# Patient Record
Sex: Female | Born: 1970 | Race: White | Hispanic: No | Smoking: Never smoker
Health system: Southern US, Community
[De-identification: ages and names within clinical notes are randomized; demographics above are authoritative.]

## PROBLEM LIST (undated history)

## (undated) DIAGNOSIS — I10 Essential (primary) hypertension: Secondary | ICD-10-CM

## (undated) HISTORY — PX: EXPLORATORY LAPAROTOMY: SUR591

---

## 2005-02-14 ENCOUNTER — Other Ambulatory Visit: Payer: Self-pay

## 2005-02-14 ENCOUNTER — Emergency Department: Payer: Self-pay | Admitting: Emergency Medicine

## 2006-08-02 ENCOUNTER — Encounter: Admission: RE | Admit: 2006-08-02 | Discharge: 2006-08-02 | Payer: Self-pay | Admitting: Family Medicine

## 2008-10-12 ENCOUNTER — Emergency Department: Payer: Self-pay | Admitting: Internal Medicine

## 2009-09-14 IMAGING — CR DG CHEST 2V
1 series · 2 of 2 positions shown · non-contrast
Comparison: none

REASON FOR EXAM: Chest Pain
COMMENTS:

PROCEDURE:     DXR - DXR CHEST PA (OR AP) AND LATERAL  - October 12, 2008  [DATE]
RESULT:     The lungs are well expanded. There is no focal infiltrate. The
heart is normal in size. There is no pleural effusion.

[Series 1: view not recorded · 0.17mm/px · 2 of 2 slices shown]
[im 1/2]
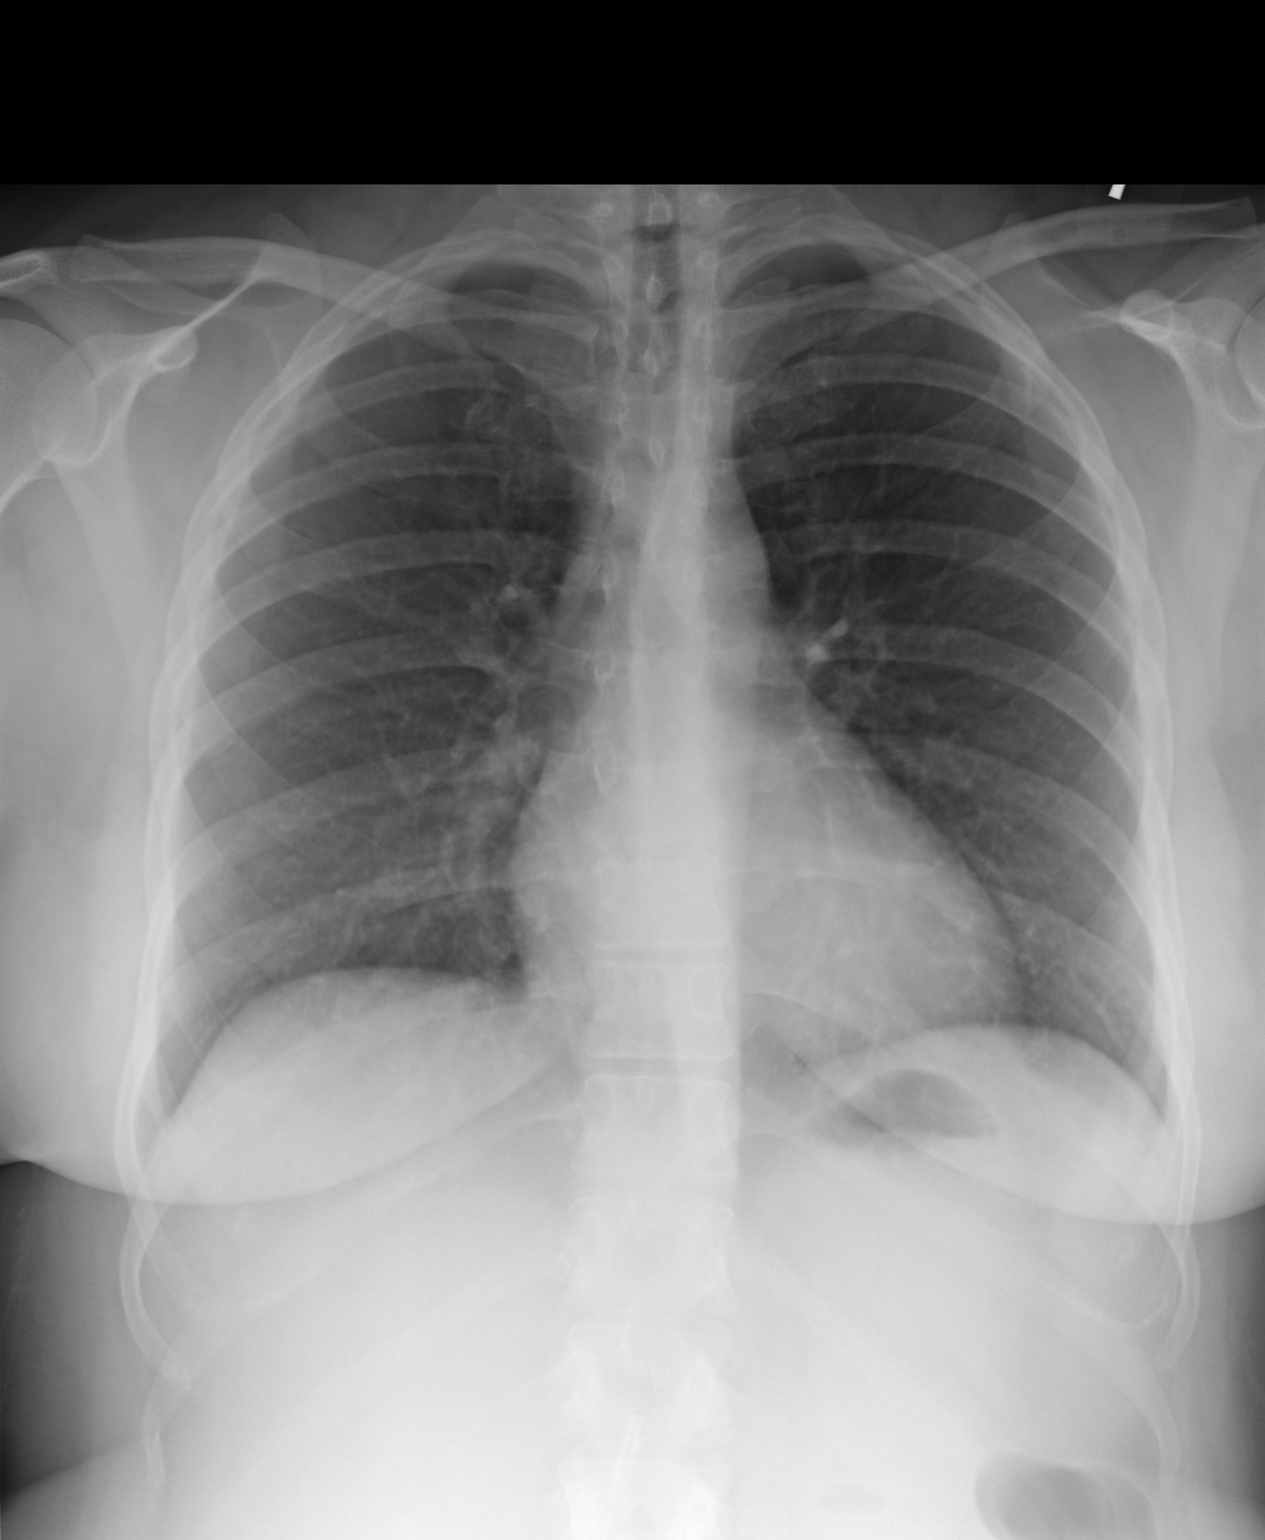
[im 2/2]
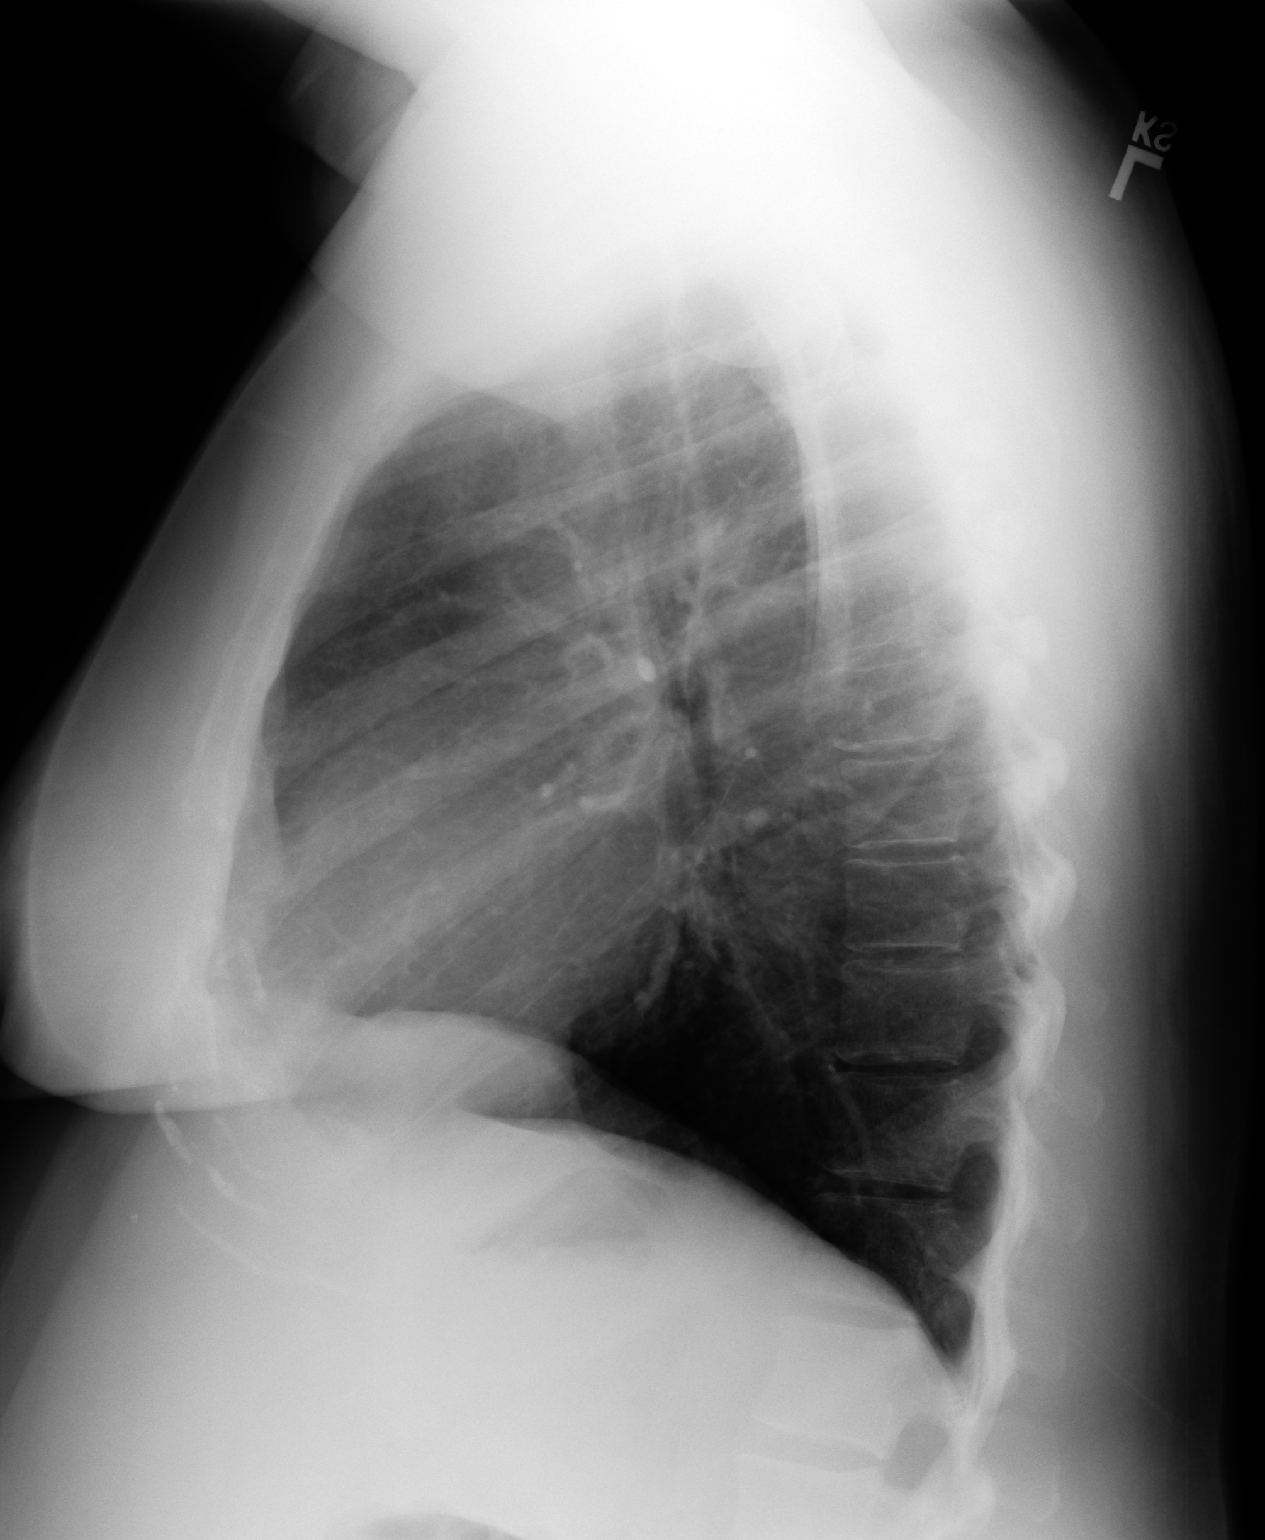

[2 of 2 positions shown; findings below may reference images not displayed]

IMPRESSION: I do not see objective evidence of acute cardiopulmonary abnormality.
Followup imaging is available if the patient's chest discomfort persists.

## 2010-04-15 ENCOUNTER — Ambulatory Visit: Payer: Self-pay

## 2010-04-27 ENCOUNTER — Ambulatory Visit: Payer: Self-pay

## 2018-03-30 ENCOUNTER — Other Ambulatory Visit: Payer: Self-pay | Admitting: Family Medicine

## 2018-03-30 DIAGNOSIS — Z1231 Encounter for screening mammogram for malignant neoplasm of breast: Secondary | ICD-10-CM

## 2018-04-20 ENCOUNTER — Encounter: Payer: Self-pay | Admitting: Radiology

## 2018-04-20 ENCOUNTER — Ambulatory Visit
Admission: RE | Admit: 2018-04-20 | Discharge: 2018-04-20 | Disposition: A | Discharge status: Home / Self Care | Payer: 59 | Source: Ambulatory Visit | Attending: Family Medicine | Admitting: Family Medicine

## 2018-04-20 DIAGNOSIS — Z1231 Encounter for screening mammogram for malignant neoplasm of breast: Secondary | ICD-10-CM

## 2019-08-08 ENCOUNTER — Other Ambulatory Visit: Payer: Self-pay | Admitting: Family Medicine

## 2019-08-09 ENCOUNTER — Other Ambulatory Visit: Payer: Self-pay | Admitting: Family Medicine

## 2019-08-09 DIAGNOSIS — N6321 Unspecified lump in the left breast, upper outer quadrant: Secondary | ICD-10-CM

## 2019-08-20 ENCOUNTER — Other Ambulatory Visit: Payer: Self-pay

## 2019-08-20 ENCOUNTER — Ambulatory Visit
Admission: RE | Admit: 2019-08-20 | Discharge: 2019-08-20 | Disposition: A | Discharge status: Home / Self Care | Payer: 59 | Source: Ambulatory Visit | Attending: Family Medicine | Admitting: Family Medicine

## 2019-08-20 DIAGNOSIS — N6321 Unspecified lump in the left breast, upper outer quadrant: Secondary | ICD-10-CM

## 2019-10-23 ENCOUNTER — Other Ambulatory Visit: Payer: Self-pay | Admitting: Student

## 2019-10-23 DIAGNOSIS — R748 Abnormal levels of other serum enzymes: Secondary | ICD-10-CM

## 2019-10-29 ENCOUNTER — Ambulatory Visit: Payer: 59 | Attending: Student

## 2020-07-22 IMAGING — MG DIGITAL DIAGNOSTIC BILATERAL MAMMOGRAM WITH TOMO AND CAD
6 of 12 series · 6 of 36 positions shown · non-contrast
Comparison: Previous exam(s).

CLINICAL DATA: Recent focal soreness and palpable lumpiness in the
axillary tail of the left breast/lower left axilla.

EXAM:
DIGITAL DIAGNOSTIC BILATERAL MAMMOGRAM WITH CAD AND TOMO
ULTRASOUND LEFT BREAST

[L MLO synth-2D]
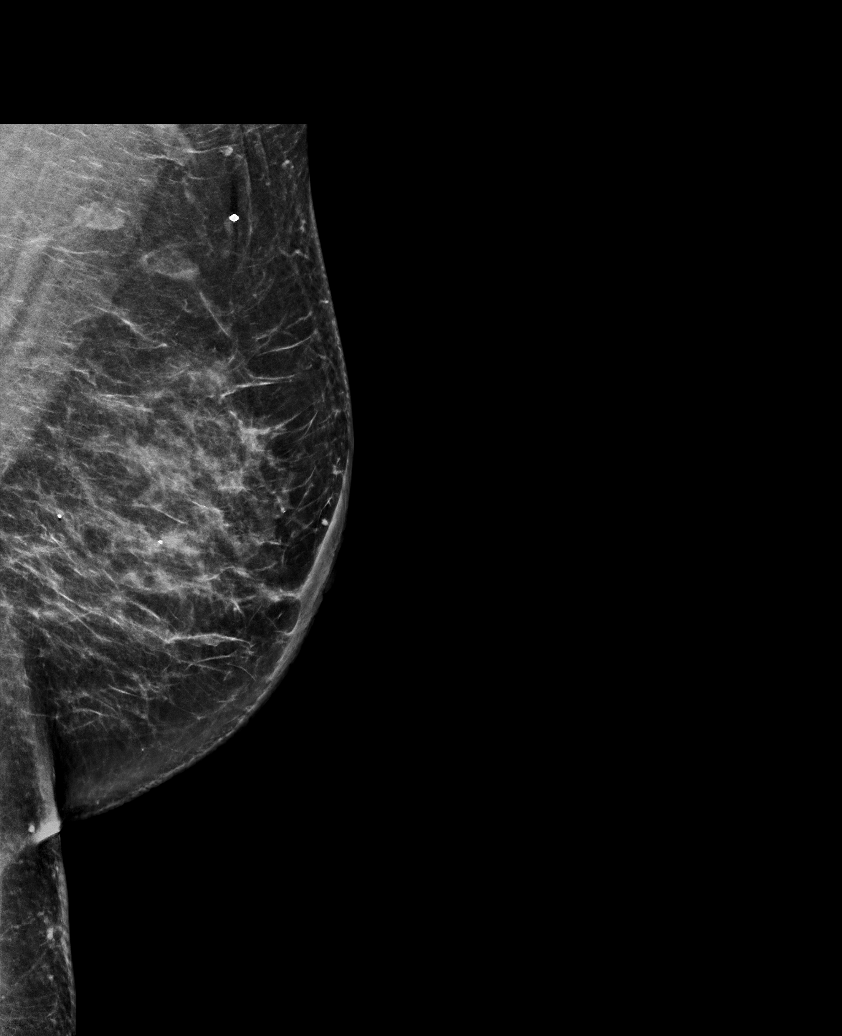

[L TAN synth-2D]
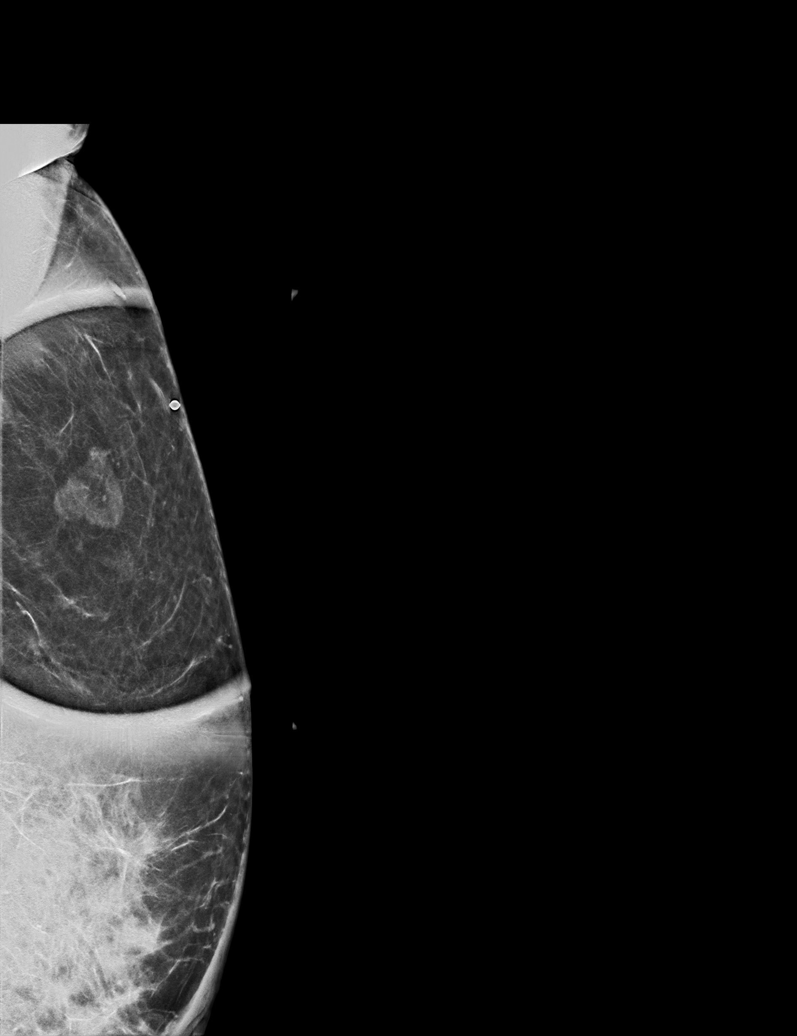

[R MLO synth-2D]
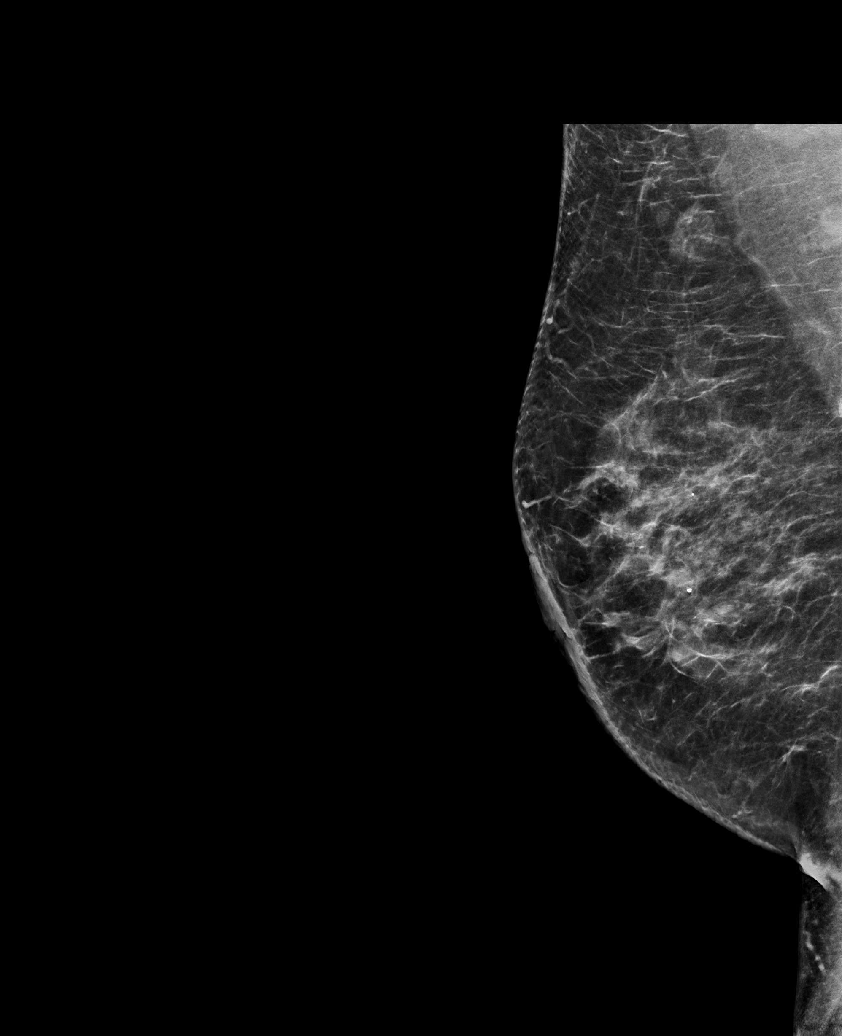

[R CC synth-2D]
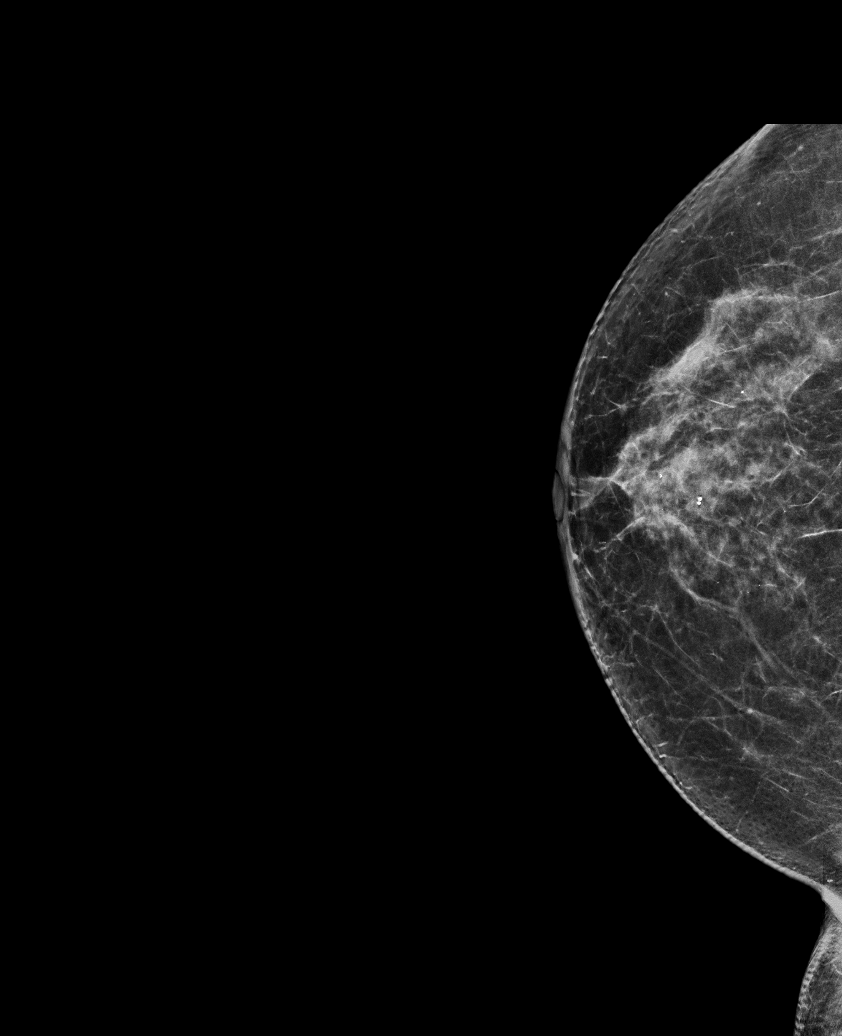

[L CC synth-2D]
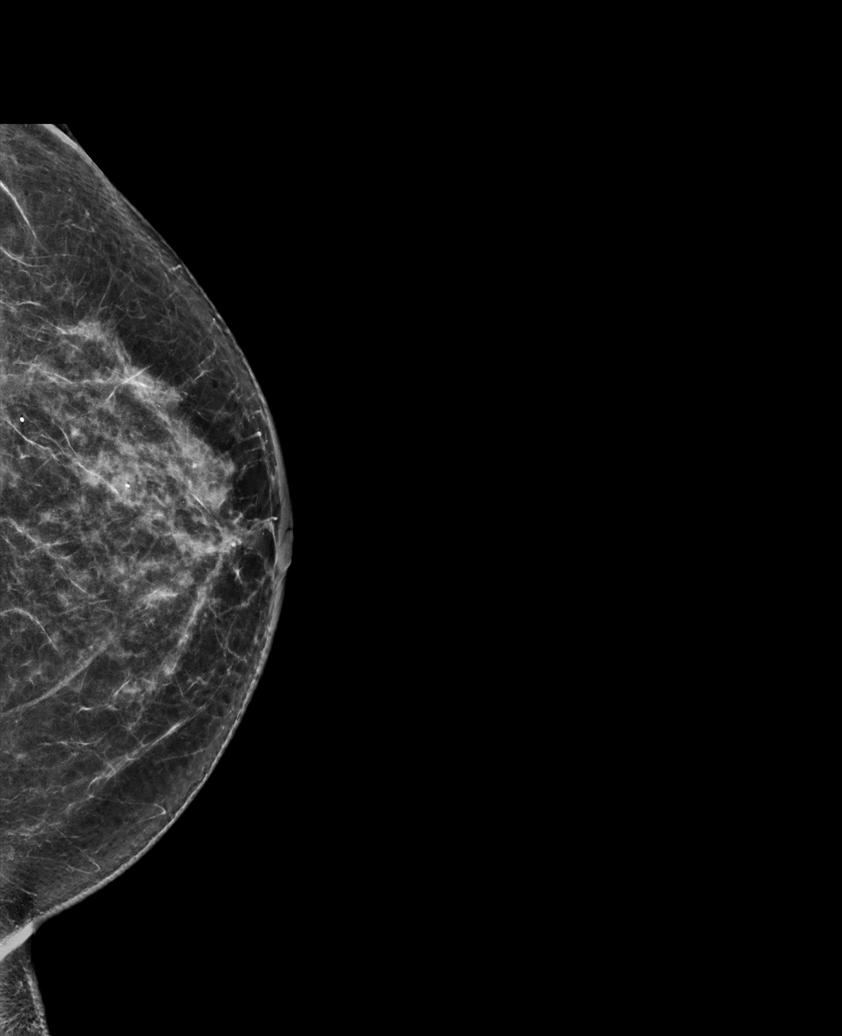

[L XCCL synth-2D]
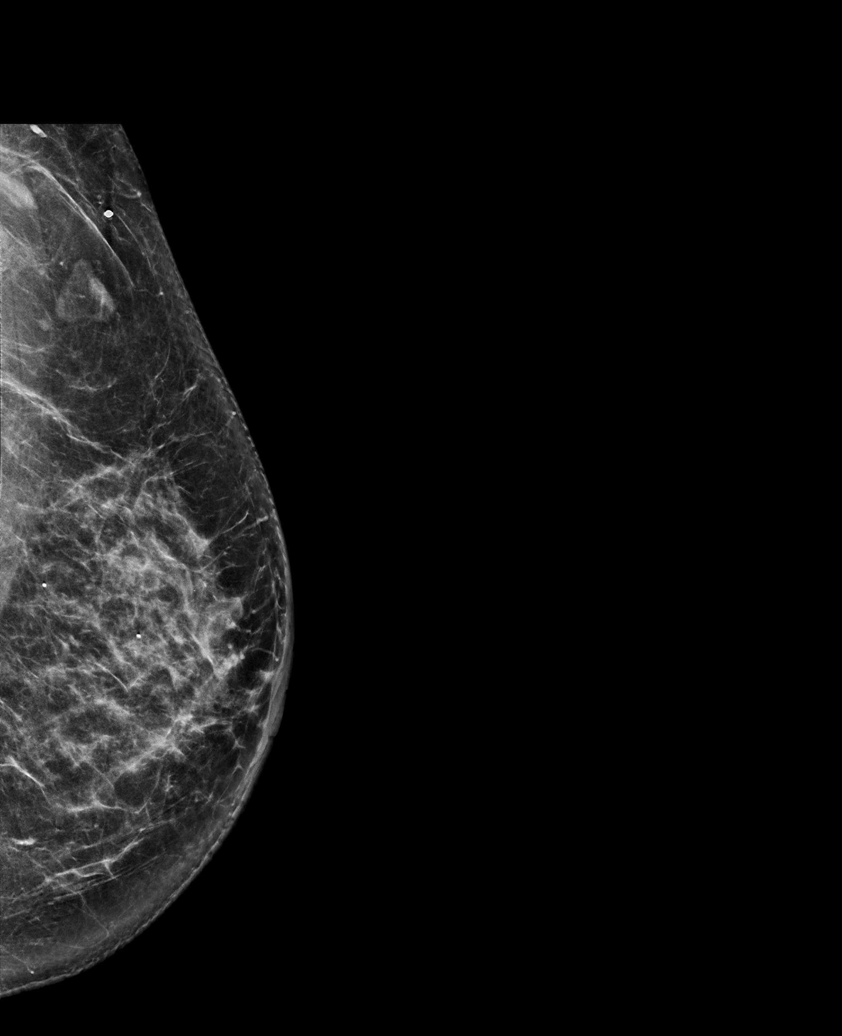

[6 of 36 positions shown; findings below may reference images not displayed]

ACR Breast Density Category c: The breast tissue is heterogeneously
dense, which may obscure small masses.
FINDINGS: Metallic skin marker was placed in the region of patient concern.
Spot tangential view of the lower left axilla/axillary tail region
shows normal and stable appearing lymph nodes, and normal fatty
tissue. No suspicious findings.

No mass, suspicious microcalcification, or architectural distortion
is identified in either breast to suggest malignancy.

Mammographic images were processed with CAD.

On physical exam, I palpate soft nodularity in the axillary tail of
the left breast/lower left axilla. I do not palpate a suspicious
mass or lymphadenopathy.

Targeted ultrasound is performed, showing normal predominately fatty
tissue, and normal axillary lymph nodes. No lymphadenopathy, solid
mass, or cystic mass is identified. Normal skin thickness.
IMPRESSION: No evidence of malignancy in either breast. No suspicious findings
in the region of recent patient concern on the left.

RECOMMENDATION:
Screening mammogram in one year.(Code:DK-V-8FZ)

I have discussed the findings and recommendations with the patient.
Results were also provided in writing at the conclusion of the
visit. If applicable, a reminder letter will be sent to the patient
regarding the next appointment.

BI-RADS CATEGORY  1: Negative.

## 2020-07-22 IMAGING — US ULTRASOUND LEFT BREAST LIMITED
1 series · 2 of 2 positions shown · non-contrast
Comparison: Previous exam(s).

CLINICAL DATA: Recent focal soreness and palpable lumpiness in the
axillary tail of the left breast/lower left axilla.

EXAM:
DIGITAL DIAGNOSTIC BILATERAL MAMMOGRAM WITH CAD AND TOMO
ULTRASOUND LEFT BREAST

[Series 1: ultrasound left breast limited · 0.07mm/px · 2 of 2 slices shown]
[im 1/2]
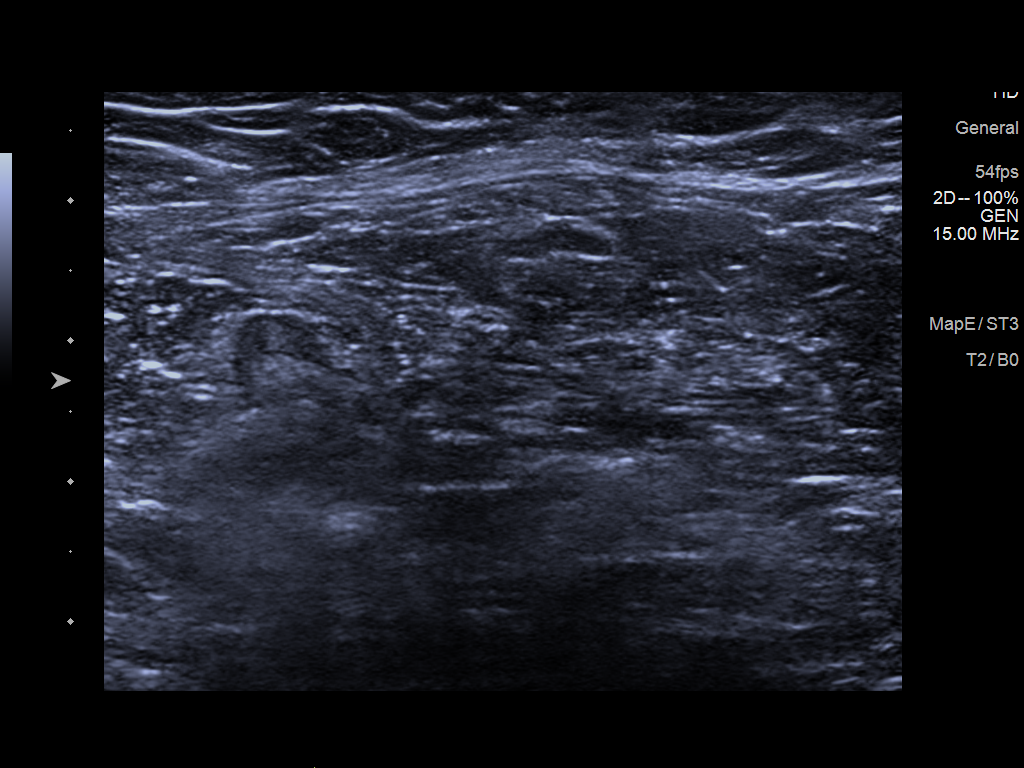
[im 2/2]
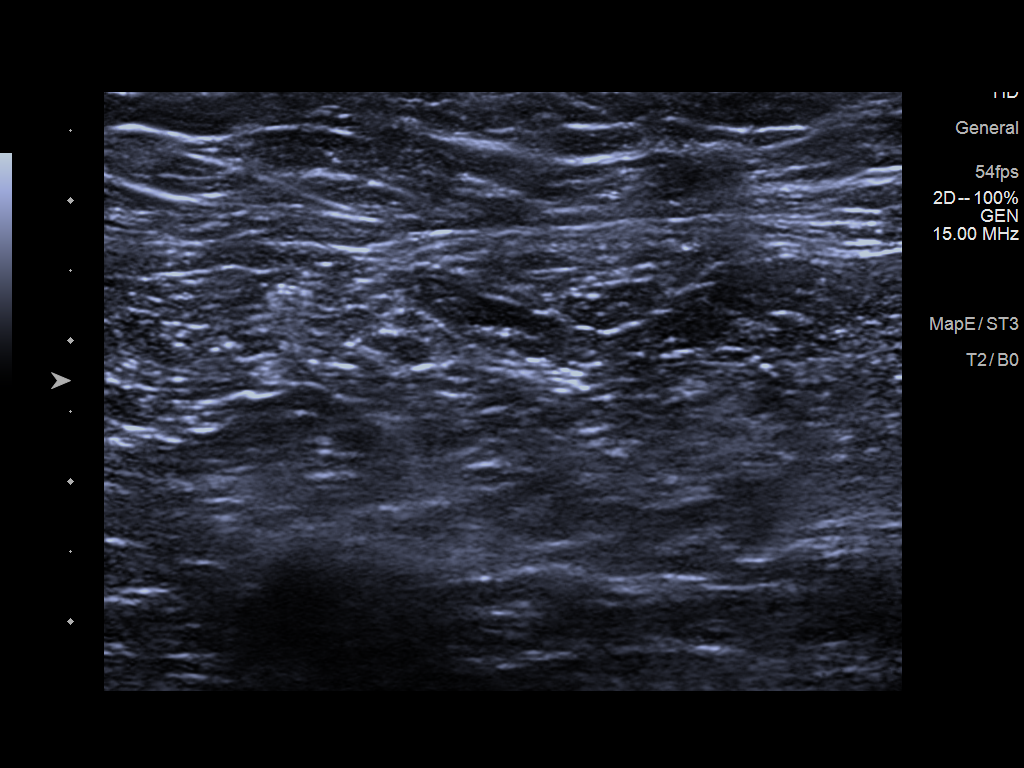

[2 of 2 positions shown; findings below may reference images not displayed]

ACR Breast Density Category c: The breast tissue is heterogeneously
dense, which may obscure small masses.
FINDINGS: Metallic skin marker was placed in the region of patient concern.
Spot tangential view of the lower left axilla/axillary tail region
shows normal and stable appearing lymph nodes, and normal fatty
tissue. No suspicious findings.

No mass, suspicious microcalcification, or architectural distortion
is identified in either breast to suggest malignancy.

Mammographic images were processed with CAD.

On physical exam, I palpate soft nodularity in the axillary tail of
the left breast/lower left axilla. I do not palpate a suspicious
mass or lymphadenopathy.

Targeted ultrasound is performed, showing normal predominately fatty
tissue, and normal axillary lymph nodes. No lymphadenopathy, solid
mass, or cystic mass is identified. Normal skin thickness.
IMPRESSION: No evidence of malignancy in either breast. No suspicious findings
in the region of recent patient concern on the left.

RECOMMENDATION:
Screening mammogram in one year.(Code:DK-V-8FZ)

I have discussed the findings and recommendations with the patient.
Results were also provided in writing at the conclusion of the
visit. If applicable, a reminder letter will be sent to the patient
regarding the next appointment.

BI-RADS CATEGORY  1: Negative.

## 2023-04-17 ENCOUNTER — Ambulatory Visit
Admission: RE | Admit: 2023-04-17 | Discharge: 2023-04-17 | Disposition: A | Discharge status: Home / Self Care | Payer: Medicaid Other | Source: Ambulatory Visit

## 2023-04-17 VITALS — BP 152/88 | HR 99 | Temp 98.2°F | Resp 18

## 2023-04-17 DIAGNOSIS — I10 Essential (primary) hypertension: Secondary | ICD-10-CM

## 2023-04-17 DIAGNOSIS — U071 COVID-19: Secondary | ICD-10-CM

## 2023-04-17 DIAGNOSIS — R197 Diarrhea, unspecified: Secondary | ICD-10-CM | POA: Diagnosis not present

## 2023-04-17 DIAGNOSIS — R112 Nausea with vomiting, unspecified: Secondary | ICD-10-CM

## 2023-04-17 HISTORY — DX: Essential (primary) hypertension: I10

## 2023-04-17 MED ORDER — ONDANSETRON 4 MG PO TBDP
4.0000 mg | ORAL_TABLET | Freq: Once | ORAL | Status: AC
  Administered 2023-04-17: 4 mg via ORAL

## 2023-04-17 MED ORDER — ONDANSETRON 4 MG PO TBDP: 4.0000 mg | ORAL_TABLET | Freq: Three times a day (TID) | ORAL | 0 refills | Status: DC | PRN

## 2023-04-17 NOTE — ED Triage Notes (Signed)
Patient to Urgent Care with complaints of cough/ nasal congestion/ vomiting/ diarrhea/ fevers/ chills/ body aches/ poor appetite. Symptoms started ten days ago.   Positive home Covid test- first positive on Tuesday.

## 2023-04-17 NOTE — Discharge Instructions (Addendum)
Take the antinausea medication as directed.    Keep yourself hydrated with clear liquids, such as water and Gatorade.  Follow the diarrhea diet as tolerated.   Go to the emergency department if you have worsening symptoms.    Your blood pressure is elevated today at 160/96; repeat 152/88.  Please have this rechecked by your primary care provider in 1-2 weeks.

## 2023-04-17 NOTE — ED Provider Notes (Signed)
Renaldo Fiddler    CSN: [id number] Arrival date & time: 04/17/23  0846      History   Chief Complaint Chief Complaint  Patient presents with   Cough    Tested positive for covid at home - Entered by patient    HPI Christine Schaefer is a 52 y.o. female.  Presents with 10-day history of fever, chills, body aches, congestion, cough, vomiting, diarrhea.  Positive COVID test at home on 04/12/2023.  No fever in the past 3 days.  1 episode of emesis and 3 episodes of diarrhea this morning.  Patient reports she is unable to keep food down but has been sipping on water.  No recent travel or antibiotic use.  No medications taken at home.  She denies rash, chest pain, shortness of breath, or other symptoms.  Her medical history includes hypertension, hyperlipidemia, hepatic steatosis, anxiety, GERD.  The history is provided by the patient and medical records.    Past Medical History:  Diagnosis Date   Hypertension     There are no problems to display for this patient.   Past Surgical History:  Procedure Laterality Date   EXPLORATORY LAPAROTOMY      OB History   No obstetric history on file.      Home Medications    Prior to Admission medications   Medication Sig Start Date End Date Taking? Authorizing Provider  citalopram (CELEXA) 20 MG tablet Take 2 tablets by mouth daily. 03/15/22  Yes [provider]  lisinopril-hydrochlorothiazide (ZESTORETIC) 20-12.5 MG tablet Take 2 tablets by mouth daily. 03/12/22  Yes [provider]  ondansetron (ZOFRAN-ODT) 4 MG disintegrating tablet Take 1 tablet (4 mg total) by mouth every 8 (eight) hours as needed for nausea or vomiting. 04/17/23  Yes Mickie Bail, NP  lansoprazole (PREVACID) 15 MG capsule Take by mouth.    [provider]    Family History Family History  Problem Relation Age of Onset   Breast cancer Neg Hx     Social History Social History   Tobacco Use   Smoking status: Never   Smokeless  tobacco: Never  Substance Use Topics   Alcohol use: Yes   Drug use: Never     Allergies   Patient has no known allergies.   Review of Systems Review of Systems  Constitutional:  Positive for chills and fever.  HENT:  Positive for congestion. Negative for ear pain and sore throat.   Respiratory:  Positive for cough. Negative for shortness of breath.   Cardiovascular:  Negative for chest pain and palpitations.  Gastrointestinal:  Positive for diarrhea, nausea and vomiting. Negative for abdominal pain.  Skin:  Negative for rash.  All other systems reviewed and are negative.    Physical Exam Triage Vital Signs ED Triage Vitals  Enc Vitals Group     BP 04/17/23 0859 (S) (!) 160/96     Pulse Rate 04/17/23 0853 99     Resp 04/17/23 0853 18     Temp 04/17/23 0853 98.2 F (36.8 C)     Temp src --      SpO2 04/17/23 0853 98 %     Weight --      Height --      Head Circumference --      Peak Flow --      Pain Score 04/17/23 0856 3     Pain Loc --      Pain Edu? --      Excl.  in GC? --    No data found.  Updated Vital Signs BP (!) 152/88   Pulse 99   Temp 98.2 F (36.8 C)   Resp 18   SpO2 98%   Visual Acuity Right Eye Distance:   Left Eye Distance:   Bilateral Distance:    Right Eye Near:   Left Eye Near:    Bilateral Near:     Physical Exam Vitals and nursing note reviewed.  Constitutional:      General: She is not in acute distress.    Appearance: Normal appearance. She is well-developed. She is not ill-appearing.  HENT:     Right Ear: Tympanic membrane normal.     Left Ear: Tympanic membrane normal.     Nose: Nose normal.     Mouth/Throat:     Mouth: Mucous membranes are moist.     Pharynx: Oropharynx is clear.  Cardiovascular:     Rate and Rhythm: Normal rate and regular rhythm.     Heart sounds: Normal heart sounds.  Pulmonary:     Effort: Pulmonary effort is normal. No respiratory distress.     Breath sounds: Normal breath sounds.  Abdominal:      General: Bowel sounds are normal.     Palpations: Abdomen is soft.     Tenderness: There is no abdominal tenderness. There is no guarding or rebound.  Musculoskeletal:     Cervical back: Neck supple.  Skin:    General: Skin is warm and dry.  Neurological:     Mental Status: She is alert.  Psychiatric:        Mood and Affect: Mood normal.        Behavior: Behavior normal.      UC Treatments / Results  Labs (all labs ordered are listed, but only abnormal results are displayed) Labs Reviewed - No data to display  EKG   Radiology No results found.  Procedures Procedures (including critical care time)  Medications Ordered in UC Medications  ondansetron (ZOFRAN-ODT) disintegrating tablet 4 mg (4 mg Oral Given 04/17/23 0934)    Initial Impression / Assessment and Plan / UC Course  I have reviewed the triage vital signs and the nursing notes.  Pertinent labs & imaging results that were available during my care of the patient were reviewed by me and considered in my medical decision making (see chart for details).    COVID-19, Nausea, vomiting, diarrhea, Elevated blood pressure with HTN.  Patient declines transfer to the ED.   Patient given Zofran and able to drink water here without emesis.  She states she is feeling better.  Treating nausea and vomiting with Zofran.  Discussed clear liquid diet.  Instructed patient to advance to diarrhea diet as tolerated.  Discussed maintaining oral hydration at home; ED precautions discussed.  Education provided on COVID, nausea and vomiting, diarrhea.  Also discussed with patient that her blood pressure is elevated today and needs to be rechecked by PCP in 1-2 weeks.  Education provided on managing hypertension.  She has not been able to take her blood pressure medication in the last couple of days due to her illness.  She agrees to plan of care.  Final Clinical Impressions(s) / UC Diagnoses   Final diagnoses:  COVID-19  Elevated blood  pressure reading in office with diagnosis of hypertension  Nausea vomiting and diarrhea     Discharge Instructions      Take the antinausea medication as directed.    Keep yourself hydrated with  clear liquids, such as water and Gatorade.  Follow the diarrhea diet as tolerated.   Go to the emergency department if you have worsening symptoms.    Your blood pressure is elevated today at 160/96; repeat 152/88.  Please have this rechecked by your primary care provider in 1-2 weeks.          ED Prescriptions     Medication Sig Dispense Auth. Provider   ondansetron (ZOFRAN-ODT) 4 MG disintegrating tablet Take 1 tablet (4 mg total) by mouth every 8 (eight) hours as needed for nausea or vomiting. 20 tablet Mickie Bail, NP      PDMP not reviewed this encounter.   Mickie Bail, NP 04/17/23 1014

## 2023-09-24 ENCOUNTER — Inpatient Hospital Stay: Payer: PRIVATE HEALTH INSURANCE

## 2023-09-24 ENCOUNTER — Inpatient Hospital Stay
Admission: EM | Admit: 2023-09-24 | Discharge: 2023-09-26 | DRG: 637 | Disposition: A | Discharge status: Home / Self Care | Payer: PRIVATE HEALTH INSURANCE | Attending: Internal Medicine | Admitting: Internal Medicine

## 2023-09-24 ENCOUNTER — Emergency Department: Payer: PRIVATE HEALTH INSURANCE

## 2023-09-24 ENCOUNTER — Other Ambulatory Visit: Payer: Self-pay

## 2023-09-24 DIAGNOSIS — R739 Hyperglycemia, unspecified: Secondary | ICD-10-CM

## 2023-09-24 DIAGNOSIS — E876 Hypokalemia: Secondary | ICD-10-CM | POA: Diagnosis present

## 2023-09-24 DIAGNOSIS — Z794 Long term (current) use of insulin: Secondary | ICD-10-CM | POA: Diagnosis not present

## 2023-09-24 DIAGNOSIS — K700 Alcoholic fatty liver: Secondary | ICD-10-CM | POA: Diagnosis present

## 2023-09-24 DIAGNOSIS — R569 Unspecified convulsions: Principal | ICD-10-CM

## 2023-09-24 DIAGNOSIS — E519 Thiamine deficiency, unspecified: Secondary | ICD-10-CM | POA: Diagnosis present

## 2023-09-24 DIAGNOSIS — E874 Mixed disorder of acid-base balance: Secondary | ICD-10-CM

## 2023-09-24 DIAGNOSIS — R9431 Abnormal electrocardiogram [ECG] [EKG]: Secondary | ICD-10-CM | POA: Diagnosis present

## 2023-09-24 DIAGNOSIS — R55 Syncope and collapse: Secondary | ICD-10-CM | POA: Diagnosis present

## 2023-09-24 DIAGNOSIS — E1110 Type 2 diabetes mellitus with ketoacidosis without coma: Secondary | ICD-10-CM | POA: Diagnosis present

## 2023-09-24 DIAGNOSIS — E869 Volume depletion, unspecified: Secondary | ICD-10-CM | POA: Diagnosis present

## 2023-09-24 DIAGNOSIS — F1010 Alcohol abuse, uncomplicated: Secondary | ICD-10-CM | POA: Diagnosis present

## 2023-09-24 DIAGNOSIS — E785 Hyperlipidemia, unspecified: Secondary | ICD-10-CM | POA: Diagnosis present

## 2023-09-24 DIAGNOSIS — K219 Gastro-esophageal reflux disease without esophagitis: Secondary | ICD-10-CM | POA: Diagnosis present

## 2023-09-24 DIAGNOSIS — R4020 Unspecified coma: Secondary | ICD-10-CM

## 2023-09-24 DIAGNOSIS — F39 Unspecified mood [affective] disorder: Secondary | ICD-10-CM | POA: Insufficient documentation

## 2023-09-24 DIAGNOSIS — E538 Deficiency of other specified B group vitamins: Secondary | ICD-10-CM | POA: Diagnosis present

## 2023-09-24 DIAGNOSIS — R000 Tachycardia, unspecified: Secondary | ICD-10-CM | POA: Diagnosis present

## 2023-09-24 DIAGNOSIS — G9341 Metabolic encephalopathy: Secondary | ICD-10-CM | POA: Diagnosis present

## 2023-09-24 DIAGNOSIS — R7401 Elevation of levels of liver transaminase levels: Secondary | ICD-10-CM | POA: Diagnosis present

## 2023-09-24 DIAGNOSIS — D751 Secondary polycythemia: Secondary | ICD-10-CM | POA: Diagnosis present

## 2023-09-24 DIAGNOSIS — R7989 Other specified abnormal findings of blood chemistry: Secondary | ICD-10-CM | POA: Insufficient documentation

## 2023-09-24 DIAGNOSIS — I10 Essential (primary) hypertension: Secondary | ICD-10-CM | POA: Diagnosis present

## 2023-09-24 DIAGNOSIS — E1165 Type 2 diabetes mellitus with hyperglycemia: Secondary | ICD-10-CM

## 2023-09-24 LAB — CBC
HCT: 38.4 % (ref 36.0–46.0)
Hemoglobin: 14.2 g/dL (ref 12.0–15.0)
MCH: 36.5 pg — ABNORMAL HIGH (ref 26.0–34.0)
MCHC: 37 g/dL — ABNORMAL HIGH (ref 30.0–36.0)
MCV: 98.7 fL (ref 80.0–100.0)
Platelets: 180 10*3/uL (ref 150–400)
RBC: 3.89 MIL/uL (ref 3.87–5.11)
RDW: 12 % (ref 11.5–15.5)
WBC: 10 10*3/uL (ref 4.0–10.5)
nRBC: 0 % (ref 0.0–0.2)

## 2023-09-24 LAB — BLOOD GAS, VENOUS
Acid-Base Excess: 5.9 mmol/L — ABNORMAL HIGH (ref 0.0–2.0)
Bicarbonate: 29.7 mmol/L — ABNORMAL HIGH (ref 20.0–28.0)
O2 Saturation: 77.2 %
Patient temperature: 37
pCO2, Ven: 39 mm[Hg] — ABNORMAL LOW (ref 44–60)
pH, Ven: 7.49 — ABNORMAL HIGH (ref 7.25–7.43)
pO2, Ven: 49 mm[Hg] — ABNORMAL HIGH (ref 32–45)

## 2023-09-24 LAB — COMPREHENSIVE METABOLIC PANEL
ALT: 37 U/L (ref 0–44)
AST: 76 U/L — ABNORMAL HIGH (ref 15–41)
Albumin: 4.1 g/dL (ref 3.5–5.0)
Alkaline Phosphatase: 42 U/L (ref 38–126)
Anion gap: 24 — ABNORMAL HIGH (ref 5–15)
BUN: 16 mg/dL (ref 6–20)
CO2: 22 mmol/L (ref 22–32)
Calcium: 7.8 mg/dL — ABNORMAL LOW (ref 8.9–10.3)
Chloride: 89 mmol/L — ABNORMAL LOW (ref 98–111)
Creatinine, Ser: 1.01 mg/dL — ABNORMAL HIGH (ref 0.44–1.00)
GFR, Estimated: >60 mL/min (ref >60)
Glucose, Bld: 308 mg/dL — ABNORMAL HIGH (ref 70–99)
Potassium: 2.5 mmol/L — CL (ref 3.5–5.1)
Sodium: 135 mmol/L (ref 135–145)
Total Bilirubin: 1.5 mg/dL — ABNORMAL HIGH (ref 0.3–1.2)
Total Protein: 7.9 g/dL (ref 6.5–8.1)

## 2023-09-24 LAB — BETA-HYDROXYBUTYRIC ACID
Beta-Hydroxybutyric Acid: 2.42 mmol/L — ABNORMAL HIGH (ref 0.05–0.27)
Beta-Hydroxybutyric Acid: 0.62 mmol/L — ABNORMAL HIGH (ref 0.05–0.27)

## 2023-09-24 LAB — BASIC METABOLIC PANEL
Anion gap: 14 (ref 5–15)
Anion gap: 11 (ref 5–15)
BUN: 15 mg/dL (ref 6–20)
BUN: 15 mg/dL (ref 6–20)
CO2: 27 mmol/L (ref 22–32)
CO2: 26 mmol/L (ref 22–32)
Calcium: 7.3 mg/dL — ABNORMAL LOW (ref 8.9–10.3)
Calcium: 7.3 mg/dL — ABNORMAL LOW (ref 8.9–10.3)
Chloride: 98 mmol/L (ref 98–111)
Chloride: 100 mmol/L (ref 98–111)
Creatinine, Ser: 0.79 mg/dL (ref 0.44–1.00)
Creatinine, Ser: 0.8 mg/dL (ref 0.44–1.00)
GFR, Estimated: >60 mL/min (ref >60)
GFR, Estimated: >60 mL/min (ref >60)
Glucose, Bld: 91 mg/dL (ref 70–99)
Glucose, Bld: 133 mg/dL — ABNORMAL HIGH (ref 70–99)
Potassium: 2.8 mmol/L — ABNORMAL LOW (ref 3.5–5.1)
Potassium: 2.8 mmol/L — ABNORMAL LOW (ref 3.5–5.1)
Sodium: 139 mmol/L (ref 135–145)
Sodium: 137 mmol/L (ref 135–145)

## 2023-09-24 LAB — CBC WITH DIFFERENTIAL/PLATELET
Abs Immature Granulocytes: 0.02 10*3/uL (ref 0.00–0.07)
Basophils Absolute: 0 10*3/uL (ref 0.0–0.1)
Basophils Relative: 1 %
Eosinophils Absolute: 0.1 10*3/uL (ref 0.0–0.5)
Eosinophils Relative: 1 %
HCT: 41.3 % (ref 36.0–46.0)
Hemoglobin: 15.3 g/dL — ABNORMAL HIGH (ref 12.0–15.0)
Immature Granulocytes: 0 %
Lymphocytes Relative: 13 %
Lymphs Abs: 1 10*3/uL (ref 0.7–4.0)
MCH: 36.4 pg — ABNORMAL HIGH (ref 26.0–34.0)
MCHC: 37 g/dL — ABNORMAL HIGH (ref 30.0–36.0)
MCV: 98.3 fL (ref 80.0–100.0)
Monocytes Absolute: 0.4 10*3/uL (ref 0.1–1.0)
Monocytes Relative: 5 %
Neutro Abs: 6.1 10*3/uL (ref 1.7–7.7)
Neutrophils Relative %: 80 %
Platelets: 164 10*3/uL (ref 150–400)
RBC: 4.2 MIL/uL (ref 3.87–5.11)
RDW: 12 % (ref 11.5–15.5)
WBC: 7.7 10*3/uL (ref 4.0–10.5)
nRBC: 0 % (ref 0.0–0.2)

## 2023-09-24 LAB — CK: Total CK: 355 U/L — ABNORMAL HIGH (ref 38–234)

## 2023-09-24 LAB — ETHANOL: Alcohol, Ethyl (B): <10 mg/dL (ref <10)

## 2023-09-24 LAB — VITAMIN B12: Vitamin B-12: 169 pg/mL — ABNORMAL LOW (ref 180–914)

## 2023-09-24 LAB — CREATININE, SERUM
Creatinine, Ser: 0.87 mg/dL (ref 0.44–1.00)
GFR, Estimated: >60 mL/min (ref >60)

## 2023-09-24 LAB — TSH: TSH: 1.292 u[IU]/mL (ref 0.350–4.500)

## 2023-09-24 LAB — TROPONIN I (HIGH SENSITIVITY)
Troponin I (High Sensitivity): 10 ng/L (ref <18)
Troponin I (High Sensitivity): 13 ng/L (ref <18)

## 2023-09-24 LAB — URINE DRUG SCREEN, QUALITATIVE (ARMC ONLY)
Amphetamines, Ur Screen: NOT DETECTED
Barbiturates, Ur Screen: NOT DETECTED
Benzodiazepine, Ur Scrn: NOT DETECTED
Cannabinoid 50 Ng, Ur ~~LOC~~: NOT DETECTED
Cocaine Metabolite,Ur ~~LOC~~: NOT DETECTED
MDMA (Ecstasy)Ur Screen: NOT DETECTED
Methadone Scn, Ur: NOT DETECTED
Opiate, Ur Screen: NOT DETECTED
Phencyclidine (PCP) Ur S: NOT DETECTED
Tricyclic, Ur Screen: NOT DETECTED

## 2023-09-24 LAB — MAGNESIUM
Magnesium: 1 mg/dL — ABNORMAL LOW (ref 1.7–2.4)
Magnesium: 2.1 mg/dL (ref 1.7–2.4)

## 2023-09-24 LAB — URINALYSIS, COMPLETE (UACMP) WITH MICROSCOPIC
Bilirubin Urine: NEGATIVE
Glucose, UA: >=500 mg/dL — AB
Ketones, ur: 20 mg/dL — AB
Leukocytes,Ua: NEGATIVE
Nitrite: NEGATIVE
Protein, ur: 30 mg/dL — AB
Specific Gravity, Urine: 1.008 (ref 1.005–1.030)
pH: 6 (ref 5.0–8.0)

## 2023-09-24 LAB — GLUCOSE, CAPILLARY
Glucose-Capillary: 111 mg/dL — ABNORMAL HIGH (ref 70–99)
Glucose-Capillary: 141 mg/dL — ABNORMAL HIGH (ref 70–99)

## 2023-09-24 LAB — CBG MONITORING, ED: Glucose-Capillary: 211 mg/dL — ABNORMAL HIGH (ref 70–99)

## 2023-09-24 LAB — LACTIC ACID, PLASMA
Lactic Acid, Venous: 6.4 mmol/L (ref 0.5–1.9)
Lactic Acid, Venous: 1.8 mmol/L (ref 0.5–1.9)

## 2023-09-24 LAB — HIV ANTIBODY (ROUTINE TESTING W REFLEX): HIV Screen 4th Generation wRfx: NONREACTIVE

## 2023-09-24 LAB — PROTIME-INR
INR: 1.1 (ref 0.8–1.2)
Prothrombin Time: 14.3 s (ref 11.4–15.2)

## 2023-09-24 LAB — HEMOGLOBIN A1C
Hgb A1c MFr Bld: 6.4 % — ABNORMAL HIGH (ref 4.8–5.6)
Mean Plasma Glucose: 136.98 mg/dL

## 2023-09-24 MED ORDER — INSULIN ASPART 100 UNIT/ML IJ SOLN
0.0000 [IU] | INTRAMUSCULAR | Status: DC
  Administered 2023-09-24: 5 [IU] via SUBCUTANEOUS
  Administered 2023-09-24: 2 [IU] via SUBCUTANEOUS
  Filled 2023-09-24 (×2): qty 1

## 2023-09-24 MED ORDER — POTASSIUM CHLORIDE 10 MEQ/100ML IV SOLN
10.0000 meq | INTRAVENOUS | Status: AC
  Administered 2023-09-24 (×2): 10 meq via INTRAVENOUS
  Filled 2023-09-24 (×2): qty 100

## 2023-09-24 MED ORDER — INSULIN GLARGINE-YFGN 100 UNIT/ML ~~LOC~~ SOLN
20.0000 [IU] | Freq: Every day | SUBCUTANEOUS | Status: DC
  Filled 2023-09-24: qty 0.2

## 2023-09-24 MED ORDER — POTASSIUM CHLORIDE 20 MEQ PO PACK
40.0000 meq | PACK | Freq: Once | ORAL | Status: AC
  Administered 2023-09-24: 40 meq via ORAL
  Filled 2023-09-24: qty 2

## 2023-09-24 MED ORDER — ADULT MULTIVITAMIN W/MINERALS CH
1.0000 | ORAL_TABLET | Freq: Every day | ORAL | Status: DC
  Administered 2023-09-24 – 2023-09-26 (×3): 1 via ORAL
  Filled 2023-09-24 (×3): qty 1

## 2023-09-24 MED ORDER — INSULIN GLARGINE-YFGN 100 UNIT/ML ~~LOC~~ SOLN
10.0000 [IU] | Freq: Every day | SUBCUTANEOUS | Status: DC
  Administered 2023-09-24 – 2023-09-26 (×3): 10 [IU] via SUBCUTANEOUS
  Filled 2023-09-24 (×3): qty 0.1

## 2023-09-24 MED ORDER — PANTOPRAZOLE SODIUM 20 MG PO TBEC
20.0000 mg | DELAYED_RELEASE_TABLET | Freq: Every day | ORAL | Status: DC
  Administered 2023-09-25 – 2023-09-26 (×2): 20 mg via ORAL
  Filled 2023-09-24 (×4): qty 1

## 2023-09-24 MED ORDER — MAGNESIUM SULFATE 2 GM/50ML IV SOLN
2.0000 g | Freq: Once | INTRAVENOUS | Status: AC
  Administered 2023-09-24: 2 g via INTRAVENOUS
  Filled 2023-09-24: qty 50

## 2023-09-24 MED ORDER — INSULIN ASPART 100 UNIT/ML IJ SOLN
10.0000 [IU] | Freq: Once | INTRAMUSCULAR | Status: AC
  Administered 2023-09-24: 10 [IU] via INTRAVENOUS
  Filled 2023-09-24: qty 1

## 2023-09-24 MED ORDER — THIAMINE MONONITRATE 100 MG PO TABS
100.0000 mg | ORAL_TABLET | Freq: Every day | ORAL | Status: DC
  Administered 2023-09-24 – 2023-09-26 (×3): 100 mg via ORAL
  Filled 2023-09-24 (×3): qty 1

## 2023-09-24 MED ORDER — LORAZEPAM 1 MG PO TABS: 1.0000 mg | ORAL_TABLET | ORAL | Status: DC | PRN

## 2023-09-24 MED ORDER — ENOXAPARIN SODIUM 40 MG/0.4ML IJ SOSY
40.0000 mg | PREFILLED_SYRINGE | INTRAMUSCULAR | Status: DC
  Administered 2023-09-24 – 2023-09-25 (×2): 40 mg via SUBCUTANEOUS
  Filled 2023-09-24 (×2): qty 0.4

## 2023-09-24 MED ORDER — POTASSIUM CHLORIDE 20 MEQ PO PACK: 40.0000 meq | PACK | Freq: Two times a day (BID) | ORAL | Status: DC

## 2023-09-24 MED ORDER — HYDRALAZINE HCL 20 MG/ML IJ SOLN
2.0000 mg | INTRAMUSCULAR | Status: DC | PRN
  Administered 2023-09-25: 2 mg via INTRAVENOUS
  Filled 2023-09-24 (×3): qty 1

## 2023-09-24 MED ORDER — GADOBUTROL 1 MMOL/ML IV SOLN
6.0000 mL | Freq: Once | INTRAVENOUS | Status: AC | PRN
  Administered 2023-09-24: 6 mL via INTRAVENOUS

## 2023-09-24 MED ORDER — SODIUM CHLORIDE 0.9 % IV BOLUS
1000.0000 mL | Freq: Once | INTRAVENOUS | Status: AC
  Administered 2023-09-24: 1000 mL via INTRAVENOUS

## 2023-09-24 MED ORDER — SODIUM CHLORIDE 0.9 % IV SOLN: INTRAVENOUS | Status: DC

## 2023-09-24 MED ORDER — FOLIC ACID 1 MG PO TABS
1.0000 mg | ORAL_TABLET | Freq: Every day | ORAL | Status: DC
  Administered 2023-09-24 – 2023-09-26 (×3): 1 mg via ORAL
  Filled 2023-09-24 (×3): qty 1

## 2023-09-24 MED ORDER — THIAMINE HCL 100 MG/ML IJ SOLN
100.0000 mg | Freq: Every day | INTRAMUSCULAR | Status: DC
  Filled 2023-09-24: qty 2

## 2023-09-24 MED ORDER — CALCIUM CARBONATE ANTACID 500 MG PO CHEW
1.0000 | CHEWABLE_TABLET | Freq: Two times a day (BID) | ORAL | Status: DC
  Administered 2023-09-24 – 2023-09-26 (×5): 200 mg via ORAL
  Filled 2023-09-24 (×5): qty 1

## 2023-09-24 NOTE — ED Notes (Signed)
MD notified of critical labs

## 2023-09-24 NOTE — Assessment & Plan Note (Signed)
Prolonged QTc ECG 12-lead independently reviewed and interpreted QTc 527 with ventricular rate 112 sinus tachycardia without significant ST changes We will replete the patient's electrolytes and hold citalopram

## 2023-09-24 NOTE — Assessment & Plan Note (Signed)
  Diabetes mellitus with hyperglycemia and ketosis Notably no acidosis, blood glucose in mid 300s patient reports last outpatient A1c was greater than 8 and beta-hydroxybutyrate is 2.42 on outpatient patient reports taking metformin on Mounjaro.  Has received 10 units of IV short acting insulin in the emergency department.  We will administer 20 units of Lantus and every 4 hour sliding scale and repeat the patient's basic metabolic panel and beta hydroxybutyrate if her ketones persist as well as her gap we will need to use a DKA protocol ask approach using an insulin drip.  Suspect will need long-term long-acting insulin also ordered MODY antibodies for completeness.

## 2023-09-24 NOTE — H&P (Addendum)
History and Physical    Patient: Christine Schaefer WUJ:[id number] DOB: 1971/10/29 DOA: 09/24/2023 DOS: the patient was seen and examined on 09/24/2023 PCP: Jerl Mina, MD  Patient coming from: Home  Chief Complaint:  Chief Complaint  Patient presents with   Loss of Consciousness   HPI: 52 year old female with a past medical history of type 2 diabetes mellitus, daily alcohol use, hyperlipidemia and migraine headaches who presented to the emergency department after she had a loss of consciousness acutely at [employer] today no bladder or bowel loss of control although did bite tongue denies any prodrome palpitations or chest pain also previously an [place].  The patient denies any localizing symptoms neurologically but does report generalized nausea and vomiting for the last 3 days with a few episodes of diarrhea which have resolved.  Although cannot be confirmed in the EMR she reports her last A1c was 8 as outpatient and she takes metformin lisinopril 25 mg citalopram 25 mg on Mounjaro as outpatient and she currently is running blood glucoses in the 300s.  Review of Systems:  Constitutional: Denies Weight Loss, Fever, Chills or Night Sweats Eyes: Denies Blurry Vision, Eye Pain or Decreased Vision Respiratory: Denies Shortness of Breath, Cough, Hemoptysis, Wheezing, Pleurisy Cardiovascular: Denies Chest Pain, Paroxsymal Nocturnal Dyspnea, Palpitations, Edema Gastrointestinal: Reports Nausea + Vomiting no Diarrhea, Hematemesis, Melena Neurological: Reports LOC All other systems were reviewed and are negative   Past Medical History:  Diagnosis Date   Hypertension    Past Surgical History:  Procedure Laterality Date   EXPLORATORY LAPAROTOMY     Social History:  reports that she has never smoked. She has never used smokeless tobacco. She reports current alcohol use. She reports that she does not use drugs.  No Known Allergies  Family History  Problem Relation Age of  Onset   Breast cancer Neg Hx     Prior to Admission medications   Medication Sig Start Date End Date Taking? Authorizing Provider  lansoprazole (PREVACID) 15 MG capsule Take by mouth.    [provider]    Physical Exam: Vitals:   09/24/23 1300 09/24/23 1437 09/24/23 1501 09/24/23 1505  BP: (!) 158/92   (!) 160/95  Pulse: 85   88  Resp: 18   16  Temp:    98 F (36.7 C)  SpO2: 97%   99%  Weight:  76.1 kg 73.5 kg   Height:   5\' 5"  (1.651 m)   Patient seen and examined approx 12:44 Room 10 of ED Constitutional:  Vital Signs as per [facility] than three noted] No Acute Distress Eyes:  Pink Conjunctiva and no Ptosis ENMT:   External Appearance of Ears and Nose without obvious deformity, masses or scar Neck:     Trachea Midline, Neck Symmetric             Thyroid without tenderness, palpable masses or nodules Respiratory:   Respiratory Effort Normal: No Use of Respiratory Muscles,No  Intercostal Retractions             Lungs Clear to Auscultation Bilaterally Cardiovascular:   Heart Auscultated: Regular Regular without any added sounds or murmurs              No Lower Extremity Edema Gastrointestinal:  Abdomen soft and nontender without palpable masses, guarding or rebound  No Palpable Splenomegaly or Hepatomegaly Lymphatic:  No Palpable Cervical Lymphadenopathy or Palpable No Axillary Lymphadenopathy Neurologic:  B/L UE and LE 5/5 Power and Sensation  Intact Psychiatric:  Patient Orientated to Time, Place and Person Patient with appropriate mood and affect Recent and Remote Memory Intact  Data Reviewed: Labs as per A/P  Assessment and Plan: Loss of consciousness (HCC) Loss of consciousness, suspected generalized seizure Patient had loss of consciousness today at [employer] [place] a [place] no loss of bladder bowel control although did bite tongue without prodrome or palpitations, CT head was unremarkable, neurology has been contacted by the emergency  department and recommended MRI of the brain and EEG and no prophylactic antiepileptics, formal consultation only if an abnormality.  Likely would be provoked either from the alcohol or her uncontrolled diabetes mellitus.  Urinalysis urinary drug screen B12 and TSH levels as well as CPK ordered   Acid-base disorder, mixed Acid-base disturbance, mixed Patient's overall venous pH was alkalotic at 7.49 with evidence of blowing off CO2 39 as well as chronic bicarbonate elevation at 29.7.  Likely multiple processes going on here including metformin induced metabolic acidosis, seizure, ketoacidosis as well as chronic bicarbonate elevation with acute hyperventilation.  Suspect might be a chronic CO2 retainer given her elevated bicarb Will repeat the VBG and basic metabolic panel to follow  Uncontrolled type 2 diabetes mellitus with hyperglycemia, with long-term current use of insulin (HCC)  Diabetes mellitus with hyperglycemia and ketosis Notably no acidosis, blood glucose in mid 300s patient reports last outpatient A1c was greater than 8 and beta-hydroxybutyrate is 2.42 on outpatient patient reports taking metformin on Mounjaro.  Has received 10 units of IV short acting insulin in the emergency department.  We will administer 20 units of Lantus and every 4 hour sliding scale and repeat the patient's basic metabolic panel and beta hydroxybutyrate if her ketones persist as well as her gap we will need to use a DKA protocol ask approach using an insulin drip.  Suspect will need long-term long-acting insulin also ordered MODY antibodies for completeness.  Abnormal ECG Prolonged QTc ECG 12-lead independently reviewed and interpreted QTc 527 with ventricular rate 112 sinus tachycardia without significant ST changes We will replete the patient's electrolytes and hold citalopram   Alcohol abuse Daily alcohol usage Will use the WAS protocol with PRN BZDs, lower suspicion this was a symptom of alcohol abuse or  alcohol withdrawal seizure but certainly is an within differential  Thiamine deficiency  Suspected thiamine deficiency In the setting of alcohol usage Will provide empiric thiamine   GERD (gastroesophageal reflux disease) Gastroesophageal reflux disease Continue lansoprazole  Mood disorder (HCC)  Mood disorder Holding her citalopram until electrolyte disturbances are resolved Given prolonged QTc   Essential hypertension Hypertension Currently in the 150s, holding home hydrochlorothiazide given the hypokalemia and holding her ACE inhibitor in the setting of acute illness , As needed hydralazine ordered for systolic blood pressure greater than 160 in the setting of her acute illness (liberal control)  Transaminitis AST 76 Suspected the sequelae of fatty liver We will follow his comprehensive metabolic panel and follow-up as outpatient if remains stable   Hypocalcemia  Hypocalcemia Calcium 7.8 Will check ionized and replete   Elevated lactic acid level  Elevated lactic acid 6.4, no suspicion of sepsis this is likely both from metformin as well as severe volume depletion due to osmotic diuresis we will monitor for any leukocytosis fever chills or signs or symptoms of localizing infection that would change this opinion, repeat lactic acid pending   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 This is likely secondary to volume depletion from osmotic diuresis  Hypokalemia  Hypokalemia Potassium is 2.5 we will check magnesium and continue to replete the patient has 3 rounds of 10 mill equivalents IV potassium ordered and will repeat the pacing basic metabolic panel     Advance Care Planning:   Code Status: Full Code  Consults: N/A  Family Communication: Updated husband Kathlene November via telephone [phone]  Severity of Illness: The appropriate patient status for this patient is INPATIENT. Inpatient status is judged to be reasonable and necessary in order to provide the  required intensity of service to ensure the patient's safety. The patient's presenting symptoms, physical exam findings, and initial radiographic and laboratory data in the context of their chronic comorbidities is felt to place them at high risk for further clinical deterioration. Furthermore, it is not anticipated that the patient will be medically stable for discharge from the hospital within 2 midnights of admission.   * I certify that at the point of admission it is my clinical judgment that the patient will require inpatient hospital care spanning beyond 2 midnights from the point of admission due to high intensity of service, high risk for further deterioration and high frequency of surveillance required.*  4:19 PM Notified BGM 100s now with IV insulin and SSI , long acting has not been given Changed to 10 from 20 units  4:37 PM Patient's potassium is 2.8 beta hydroxybutyrate is now 0.62 from 2.42 blood glucose now 91 magnesium noted at 1 the anion gap has resolved acid-base disturbances resolved lactic acid is now 1.8 Will allow carb modified diet, potassium chloride by mouth 40 mg twice daily x2 doses, IV magnesium supplementation given magnesium is low and re-checks for both  Author: Princess Bruins, MD 09/24/2023 3:34 PM  For on call review [email or web address].

## 2023-09-24 NOTE — ED Notes (Addendum)
Pt started complaining of her head hurting on the right side. Rated pain 4 on 0-10 scale. Upon assessment RN noticed indention on the right side. Patient also alerted to increase in pain when RN touched area. MD notified.

## 2023-09-24 NOTE — Assessment & Plan Note (Signed)
  Suspected thiamine deficiency In the setting of alcohol usage Will provide empiric thiamine

## 2023-09-24 NOTE — Assessment & Plan Note (Signed)
Acid-base disturbance, mixed Patient's overall venous pH was alkalotic at 7.49 with evidence of blowing off CO2 39 as well as chronic bicarbonate elevation at 29.7.  Likely multiple processes going on here including metformin induced metabolic acidosis, seizure, ketoacidosis as well as chronic bicarbonate elevation with acute hyperventilation.  Suspect might be a chronic CO2 retainer given her elevated bicarb Will repeat the VBG and basic metabolic panel to follow

## 2023-09-24 NOTE — ED Notes (Signed)
Pt to MRI

## 2023-09-24 NOTE — ED Triage Notes (Signed)
Pt found unresponsive [place] [employer]. Has bite mark on right side of tounge, no hx of seizures pt .  EMS found her post-ictal. EMS stated it occured at 0900 but not witnessed. VS: 201/120, 20s ST, 272non diabetic, Hx of HTN takes lisinoprill. From EMS Pt states they feel nauseated . EDP at bedside. Face symmetrical

## 2023-09-24 NOTE — Assessment & Plan Note (Signed)
Gastroesophageal reflux disease Continue lansoprazole

## 2023-09-24 NOTE — ED Notes (Signed)
Water provided per request.

## 2023-09-24 NOTE — Assessment & Plan Note (Signed)
AST 76 Suspected the sequelae of fatty liver We will follow his comprehensive metabolic panel and follow-up as outpatient if remains stable

## 2023-09-24 NOTE — Assessment & Plan Note (Signed)
Hypertension Currently in the 150s, holding home hydrochlorothiazide given the hypokalemia and holding her ACE inhibitor in the setting of acute illness , As needed hydralazine ordered for systolic blood pressure greater than 160 in the setting of her acute illness (liberal control)

## 2023-09-24 NOTE — Assessment & Plan Note (Signed)
  Mood disorder Holding her citalopram until electrolyte disturbances are resolved Given prolonged QTc

## 2023-09-24 NOTE — Assessment & Plan Note (Signed)
Daily alcohol usage Will use the WAS protocol with PRN BZDs, lower suspicion this was a symptom of alcohol abuse or alcohol withdrawal seizure but certainly is an within differential

## 2023-09-24 NOTE — ED Provider Notes (Signed)
[facility] Provider Note   Event Date/Time   First MD Initiated Contact with Patient 09/24/23 [id number]     (approximate) History  Loss of Consciousness  HPI Christine Schaefer is a 52 y.o. female with a stated past medical history of hypertension who presents via EMS after being found down at [employer] today with reported postictal symptoms including confusion and disorientation that resolved throughout transport.  Patient states that she remembers going to [employer] this morning as well as being in the EMS truck on the way to the emergency department but does not remember the interim.  Patient states that she only takes medication for blood pressure which is lisinopril that she took it this morning.  Patient only complains of of tongue pain as she bit it during this event. ROS: Patient currently denies any vision changes, tinnitus, difficulty speaking, facial droop, sore throat, chest pain, shortness of breath, abdominal pain, nausea/vomiting/diarrhea, dysuria, or weakness/numbness/paresthesias in any extremity   Physical Exam  Triage Vital Signs: ED Triage Vitals  Encounter Vitals Group     BP      Systolic BP Percentile      Diastolic BP Percentile      Pulse      Resp      Temp      Temp src      SpO2      Weight      Height      Head Circumference      Peak Flow      Pain Score      Pain Loc      Pain Education      Exclude from Growth Chart    Most recent vital signs: Vitals:   09/24/23 1157 09/24/23 1300  BP: (!) 156/101 (!) 158/92  Pulse: 84 85  Resp: (!) 21 18  Temp:    SpO2: 97% 97%   General: Awake, oriented x4. CV:  Good peripheral perfusion.  Resp:  Normal effort.  Abd:  No distention.  Other:  Middle-aged overweight Caucasian female resting comfortably in no acute distress.  Superficial laceration to the tip of the tongue ED Results / Procedures / Treatments  Labs (all labs ordered are listed, but only abnormal results are displayed) Labs  Reviewed  COMPREHENSIVE METABOLIC PANEL - Abnormal; Notable for the following components:      Result Value   Potassium 2.5 (*)    Chloride 89 (*)    Glucose, Bld 308 (*)    Creatinine, Ser 1.01 (*)    Calcium 7.8 (*)    AST 76 (*)    Total Bilirubin 1.5 (*)    Anion gap 24 (*)    All other components within normal limits  CBC WITH DIFFERENTIAL/PLATELET - Abnormal; Notable for the following components:   Hemoglobin 15.3 (*)    MCH 36.4 (*)    MCHC 37.0 (*)    All other components within normal limits  LACTIC ACID, PLASMA - Abnormal; Notable for the following components:   Lactic Acid, Venous 6.4 (*)    All other components within normal limits  BLOOD GAS, VENOUS - Abnormal; Notable for the following components:   pH, Ven 7.49 (*)    pCO2, Ven 39 (*)    pO2, Ven 49 (*)    Bicarbonate 29.7 (*)    Acid-Base Excess 5.9 (*)    All other components within normal limits  BETA-HYDROXYBUTYRIC ACID - Abnormal; Notable for the following components:   Beta-Hydroxybutyric  Acid 2.42 (*)    All other components within normal limits  CBG MONITORING, ED - Abnormal; Notable for the following components:   Glucose-Capillary 211 (*)    All other components within normal limits  PROTIME-INR  MAGNESIUM  CK  VITAMIN B12  VITAMIN B1  ETHANOL  URINALYSIS, COMPLETE (UACMP) WITH MICROSCOPIC  URINE DRUG SCREEN, QUALITATIVE ([facility] ONLY)  BASIC METABOLIC PANEL  BETA-HYDROXYBUTYRIC ACID  HEMOGLOBIN A1C  GLUTAMIC ACID DECARBOXYLASE AUTO ABS  CALCIUM, IONIZED  TSH  LACTIC ACID, PLASMA  HIV ANTIBODY (ROUTINE TESTING W REFLEX)  CBC  CREATININE, SERUM  TROPONIN I (HIGH SENSITIVITY)  TROPONIN I (HIGH SENSITIVITY)   EKG ED ECG REPORT I, Merwyn Katos, the attending physician, personally viewed and interpreted this ECG. Date: 09/24/2023 EKG Time: 0941 Rate: 112 Rhythm: Tachycardic sinus rhythm QRS Axis: normal Intervals: normal ST/T Wave abnormalities: normal Narrative Interpretation: no  evidence of acute ischemia RADIOLOGY ED MD interpretation: CT of the head without contrast interpreted by me shows no evidence of acute abnormalities including no intracerebral hemorrhage, obvious masses, or significant edema -Agree with radiology assessment Official radiology report(s): CT Head Wo Contrast  Result Date: 09/24/2023 CLINICAL DATA:  52 year old female with new onset seizure. EXAM: CT HEAD WITHOUT CONTRAST TECHNIQUE: Contiguous axial images were obtained from the base of the skull through the vertex without intravenous contrast. RADIATION DOSE REDUCTION: This exam was performed according to the departmental dose-optimization program which includes automated exposure control, adjustment of the mA and/or kV according to patient size and/or use of iterative reconstruction technique. COMPARISON:  None Available. FINDINGS: Brain: Cerebral volume is probably within normal limits for age. No midline shift, ventriculomegaly, mass effect, evidence of mass lesion, intracranial hemorrhage or evidence of cortically based acute infarction. Gray-white matter differentiation is within normal limits for age throughout the brain. Vascular: No suspicious intracranial vascular hyperdensity. Calcified atherosclerosis at the skull base. Skull: Intact, negative. Sinuses/Orbits: Visualized paranasal sinuses and mastoids are clear. Other: Right vertex scalp hematoma (coronal image 40) up to 7 mm in thickness. Underlying calvarium intact. Visualized orbit soft tissues are within normal limits. IMPRESSION: 1. Right vertex scalp hematoma. No skull fracture. 2. Normal for age noncontrast CT appearance of the brain for age. Calcified atherosclerosis at the skull base. Electronically Signed   By: Odessa Fleming M.D.   On: 09/24/2023 10:08   PROCEDURES: Critical Care performed: Yes, see critical care procedure note(s) .1-3 Lead EKG Interpretation  Performed by: Merwyn Katos, MD Authorized by: Merwyn Katos, MD      Interpretation: normal     ECG rate:  71   ECG rate assessment: normal     Rhythm: sinus rhythm     Ectopy: none     Conduction: normal    MEDICATIONS ORDERED IN ED: Medications  0.9 %  sodium chloride infusion (has no administration in time range)  insulin aspart (novoLOG) injection 0-15 Units (has no administration in time range)  calcium carbonate (TUMS - dosed in mg elemental calcium) chewable tablet 200 mg of elemental calcium (has no administration in time range)  enoxaparin (LOVENOX) injection 40 mg (has no administration in time range)  insulin glargine-yfgn (SEMGLEE) injection 20 Units (has no administration in time range)  sodium chloride 0.9 % bolus 1,000 mL (1,000 mLs Intravenous New Bag/Given 09/24/23 1117)  potassium chloride (KLOR-CON) packet 40 mEq (40 mEq Oral Given 09/24/23 1112)  potassium chloride 10 mEq in 100 mL IVPB (0 mEq Intravenous Stopped 09/24/23 1328)  insulin aspart (novoLOG)  injection 10 Units (10 Units Intravenous Given 09/24/23 1302)   IMPRESSION / MDM / ASSESSMENT AND PLAN / ED COURSE  I reviewed the triage vital signs and the nursing notes.                             The patient is on the cardiac monitor to evaluate for evidence of arrhythmia and/or significant heart rate changes. Patient's presentation is most consistent with acute presentation with potential threat to life or bodily function.  This patient presents to the ED for concern of seizure-like activity, this involves an extensive number of treatment options, and is a complaint that carries with it a high risk of complications and morbidity.  The differential diagnosis includes epilepsy, adverse reaction to medication, hypoglycemia, DKA, urinary tract infection, urosepsis, arrhythmia, vasovagal syncope Co morbidities that complicate the patient evaluation  Type 2 diabetes, hypertension Additional history obtained:  Additional history obtained from EMS and family at bedside  External records  from outside source obtained and reviewed including most recent annual physical exam in 2022 with Dr. Montez Morita Lab Tests:  I Ordered, and personally interpreted labs.  The pertinent results include: Lactic acidosis of 6.4, potassium of 2.5, anion gap 24, beta hydroxybutyrate 2.42, pH 7.49 Imaging Studies ordered:  I ordered imaging studies including head CT  I independently visualized and interpreted imaging which showed no evidence of acute abnormalities  I agree with the radiologist interpretation Cardiac Monitoring: / EKG:  The patient was maintained on a cardiac monitor.  I personally viewed and interpreted the cardiac monitored which showed an underlying rhythm of: Normal sinus rhythm Consultations Obtained:  I requested consultation with the neurology,  and discussed lab and imaging findings as well as pertinent plan - they recommend: Magnesium, MRI with and without contrast, spot EEG, and electrolyte replacement Problem List / ED Course / Critical interventions / Medication management  Hyperglycemia, seizure-like activity, hypokalemia  I ordered medication including potassium chloride for hypokalemia  Reevaluation of the patient after these medicines showed that the patient improved  I have reviewed the patients home medicines and have made adjustments as needed Dispo: Admitted to medicine       FINAL CLINICAL IMPRESSION(S) / ED DIAGNOSES   Final diagnoses:  Seizure-like activity (HCC)  Hyperglycemia  Hypokalemia   Rx / DC Orders   ED Discharge Orders     None      Note:  This document was prepared using Dragon voice recognition software and may include unintentional dictation errors.   Merwyn Katos, MD 09/24/23 [phone]

## 2023-09-24 NOTE — Assessment & Plan Note (Signed)
  Elevated lactic acid 6.4, no suspicion of sepsis this is likely both from metformin as well as severe volume depletion due to osmotic diuresis we will monitor for any leukocytosis fever chills or signs or symptoms of localizing infection that would change this opinion, repeat lactic acid pending

## 2023-09-24 NOTE — Assessment & Plan Note (Signed)
  Hypocalcemia Calcium 7.8 Will check ionized and replete

## 2023-09-24 NOTE — ED Notes (Signed)
Pt still in MRI 

## 2023-09-24 NOTE — ED Notes (Signed)
Advised nurse that patient has ready bed 

## 2023-09-24 NOTE — Assessment & Plan Note (Signed)
  Hypokalemia Potassium is 2.5 we will check magnesium and continue to replete the patient has 3 rounds of 10 mill equivalents IV potassium ordered and will repeat the pacing basic metabolic panel

## 2023-09-24 NOTE — Plan of Care (Signed)
Discussed with Dr. Vicente Males   52 year old with a past medical history significant for daily drinking, uncontrolled diabetes, hypertension, hyperlipidemia, tobacco abuse, migraine headaches  Presents with second time in generalized tonic-clonic seizure activity in the setting of hypophosphatemia, hyperglycemia, was postictal but mental status has been improving per ED provider  Without any focal neurological deficits or focality to the events, favor provoked events in the setting of electrolyte derangements and potential alcohol withdrawal  Current vital signs: BP (!) 156/101   Pulse 84   Temp 98.8 F (37.1 C)   Resp (!) 21   SpO2 97%  Vital signs in last 24 hours: Temp:  [98.8 F (37.1 C)] 98.8 F (37.1 C) (09/28 1000) Pulse Rate:  [84-117] 84 (09/28 1157) Resp:  [13-21] 21 (09/28 1157) BP: (156-186)/(101-110) 156/101 (09/28 1157) SpO2:  [94 %-97 %] 97 % (09/28 1157)   Basic Metabolic Panel: Recent Labs  Lab 09/24/23 0945  NA 135  K 2.5*  CL 89*  CO2 22  GLUCOSE 308*  BUN 16  CREATININE 1.01*  CALCIUM 7.8*    CBC: Recent Labs  Lab 09/24/23 0945  WBC 7.7  NEUTROABS 6.1  HGB 15.3*  HCT 41.3  MCV 98.3  PLT 164    Coagulation Studies: Recent Labs    09/24/23 0945  LABPROT 14.3  INR 1.1    Current Outpatient Medications  Medication Instructions   citalopram (CELEXA) 20 MG tablet 2 tablets, Oral, Daily   lansoprazole (PREVACID) 15 MG capsule Oral   lisinopril-hydrochlorothiazide (ZESTORETIC) 20-12.5 MG tablet 2 tablets, Oral, Daily   ondansetron (ZOFRAN-ODT) 4 mg, Oral, Every 8 hours PRN    Recommend -MRI brain with and without contrast -Routine EEG -Full neurological consultation if concerning abnormalities on MRI brain or EEG or if patient has further concerning events -CK, magnesium, thiamine, B12, ethanol level, UA, UDS -Additional toxic/metabolic/infectious workup per ED and primary team -CIWA protocol -Consider empiric thiamine and B12  supplementation pending level results -If there is no further concern for seizure activity and she continues to steadily return to baseline, no need for antiseizure medications at this time   These are curbside recommendations based upon the information readily available in the chart on brief review as well as history and examination information provided to me by requesting provider and do not replace a full detailed consult

## 2023-09-24 NOTE — Assessment & Plan Note (Signed)
Loss of consciousness, suspected generalized seizure Patient had loss of consciousness today at [employer] [place] 24 [place] no loss of bladder bowel control although did bite tongue without prodrome or palpitations, CT head was unremarkable, neurology has been contacted by the emergency department and recommended MRI of the brain and EEG and no prophylactic antiepileptics, formal consultation only if an abnormality.  Likely would be provoked either from the alcohol or her uncontrolled diabetes mellitus.  Urinalysis urinary drug screen B12 and TSH levels as well as CPK ordered

## 2023-09-24 NOTE — Assessment & Plan Note (Signed)
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

## 2023-09-24 NOTE — ED Notes (Addendum)
Husband at bedside stated that pt had a previous episode around June 24th, 2024. Patient was going into the bathroom in the [place] casino and passed out and hit her head. Pt went to local hospital. Husband said that this was the first episode around June 24th 2024.

## 2023-09-25 DIAGNOSIS — R569 Unspecified convulsions: Secondary | ICD-10-CM | POA: Diagnosis not present

## 2023-09-25 LAB — COMPREHENSIVE METABOLIC PANEL
ALT: 36 U/L (ref 0–44)
AST: 86 U/L — ABNORMAL HIGH (ref 15–41)
Albumin: 3.4 g/dL — ABNORMAL LOW (ref 3.5–5.0)
Alkaline Phosphatase: 34 U/L — ABNORMAL LOW (ref 38–126)
Anion gap: 12 (ref 5–15)
BUN: 15 mg/dL (ref 6–20)
CO2: 26 mmol/L (ref 22–32)
Calcium: 6.9 mg/dL — ABNORMAL LOW (ref 8.9–10.3)
Chloride: 102 mmol/L (ref 98–111)
Creatinine, Ser: 0.69 mg/dL (ref 0.44–1.00)
GFR, Estimated: >60 mL/min (ref >60)
Glucose, Bld: 104 mg/dL — ABNORMAL HIGH (ref 70–99)
Potassium: 2.6 mmol/L — CL (ref 3.5–5.1)
Sodium: 140 mmol/L (ref 135–145)
Total Bilirubin: 1.3 mg/dL — ABNORMAL HIGH (ref 0.3–1.2)
Total Protein: 6.2 g/dL — ABNORMAL LOW (ref 6.5–8.1)

## 2023-09-25 LAB — BASIC METABOLIC PANEL
Anion gap: 9 (ref 5–15)
Anion gap: 12 (ref 5–15)
BUN: 14 mg/dL (ref 6–20)
BUN: 13 mg/dL (ref 6–20)
CO2: 23 mmol/L (ref 22–32)
CO2: 22 mmol/L (ref 22–32)
Calcium: 7.2 mg/dL — ABNORMAL LOW (ref 8.9–10.3)
Calcium: 7.2 mg/dL — ABNORMAL LOW (ref 8.9–10.3)
Chloride: 105 mmol/L (ref 98–111)
Chloride: 104 mmol/L (ref 98–111)
Creatinine, Ser: 0.65 mg/dL (ref 0.44–1.00)
Creatinine, Ser: 0.68 mg/dL (ref 0.44–1.00)
GFR, Estimated: >60 mL/min (ref >60)
GFR, Estimated: >60 mL/min (ref >60)
Glucose, Bld: 143 mg/dL — ABNORMAL HIGH (ref 70–99)
Glucose, Bld: 130 mg/dL — ABNORMAL HIGH (ref 70–99)
Potassium: 3.8 mmol/L (ref 3.5–5.1)
Potassium: 3.5 mmol/L (ref 3.5–5.1)
Sodium: 137 mmol/L (ref 135–145)
Sodium: 138 mmol/L (ref 135–145)

## 2023-09-25 LAB — BLOOD GAS, VENOUS
Bicarbonate: 31.8 mmol/L — ABNORMAL HIGH (ref 20.0–28.0)
O2 Saturation: 24.5 mmol/L — ABNORMAL HIGH (ref 0.0–2.0)
Patient temperature: 37
Patient temperature: 37 %
pCO2, Ven: 49 mm[Hg] (ref 44–60)
pH, Ven: 7.42 (ref 7.25–7.43)
pO2, Ven: 31.8 mmol/L — ABNORMAL HIGH (ref 32–45)

## 2023-09-25 LAB — MAGNESIUM: Magnesium: 1.6 mg/dL — ABNORMAL LOW (ref 1.7–2.4)

## 2023-09-25 LAB — GLUCOSE, CAPILLARY
Glucose-Capillary: 101 mg/dL — ABNORMAL HIGH (ref 70–99)
Glucose-Capillary: 114 mg/dL — ABNORMAL HIGH (ref 70–99)
Glucose-Capillary: 114 mg/dL — ABNORMAL HIGH (ref 70–99)
Glucose-Capillary: 117 mg/dL — ABNORMAL HIGH (ref 70–99)
Glucose-Capillary: 122 mg/dL — ABNORMAL HIGH (ref 70–99)
Glucose-Capillary: 135 mg/dL — ABNORMAL HIGH (ref 70–99)

## 2023-09-25 MED ORDER — INSULIN ASPART 100 UNIT/ML IJ SOLN
0.0000 [IU] | Freq: Three times a day (TID) | INTRAMUSCULAR | Status: DC
  Administered 2023-09-25 – 2023-09-26 (×3): 1 [IU] via SUBCUTANEOUS
  Filled 2023-09-25 (×3): qty 1

## 2023-09-25 MED ORDER — ENSURE ENLIVE PO LIQD: 237.0000 mL | Freq: Two times a day (BID) | ORAL | Status: DC

## 2023-09-25 MED ORDER — CALCIUM GLUCONATE-NACL 1-0.675 GM/50ML-% IV SOLN
1.0000 g | Freq: Once | INTRAVENOUS | Status: AC
  Administered 2023-09-25: 1000 mg via INTRAVENOUS
  Filled 2023-09-25: qty 50

## 2023-09-25 MED ORDER — MAGNESIUM SULFATE 2 GM/50ML IV SOLN
2.0000 g | Freq: Once | INTRAVENOUS | Status: AC
  Administered 2023-09-25: 2 g via INTRAVENOUS
  Filled 2023-09-25: qty 50

## 2023-09-25 MED ORDER — POTASSIUM CHLORIDE 20 MEQ PO PACK
40.0000 meq | PACK | Freq: Once | ORAL | Status: AC
  Administered 2023-09-25: 40 meq via ORAL
  Filled 2023-09-25: qty 2

## 2023-09-25 MED ORDER — INSULIN ASPART 100 UNIT/ML IJ SOLN: 0.0000 [IU] | Freq: Every day | INTRAMUSCULAR | Status: DC

## 2023-09-25 MED ORDER — POTASSIUM CHLORIDE 10 MEQ/100ML IV SOLN
10.0000 meq | INTRAVENOUS | Status: AC
  Administered 2023-09-25 (×2): 10 meq via INTRAVENOUS
  Filled 2023-09-25 (×2): qty 100

## 2023-09-25 MED ORDER — VITAMIN B-12 1000 MCG PO TABS
1000.0000 ug | ORAL_TABLET | Freq: Every day | ORAL | Status: DC
  Administered 2023-09-26: 1000 ug via ORAL
  Filled 2023-09-25: qty 1

## 2023-09-25 MED ORDER — CYANOCOBALAMIN 1000 MCG/ML IJ SOLN
1000.0000 ug | Freq: Once | INTRAMUSCULAR | Status: AC
  Administered 2023-09-25: 1000 ug via INTRAMUSCULAR
  Filled 2023-09-25: qty 1

## 2023-09-25 NOTE — Plan of Care (Signed)
  Problem: Coping: Goal: Ability to adjust to condition or change in health will improve Outcome: Progressing   Problem: Tissue Perfusion: Goal: Adequacy of tissue perfusion will improve Outcome: Progressing   Problem: Education: Goal: Knowledge of General Education information will improve Description: Including pain rating scale, medication(s)/side effects and non-pharmacologic comfort measures Outcome: Progressing

## 2023-09-25 NOTE — Progress Notes (Signed)
PHARMACY CONSULT NOTE - FOLLOW UP  Pharmacy Consult for Electrolyte Monitoring and Replacement   Recent Labs: Potassium (mmol/L)  Date Value  09/25/2023 2.6 (LL)   Magnesium (mg/dL)  Date Value  16/09/9603 1.6 (L)   Calcium (mg/dL)  Date Value  54/08/8118 6.9 (L)   Albumin (g/dL)  Date Value  14/78/2956 3.4 (L)   Sodium (mmol/L)  Date Value  09/25/2023 140     Assessment: 9/29 @ 0424:  K = 2.6                        Mag = 1.6                        Ca = 6.9, Alb = 3.4, Corrected Ca = 7.38  Goal of Therapy:  Electrolytes WNL   Plan:  KCl 10 mEq IV X 2 ordered to start @ 0615. - will order additional KCl 10 mEq IV X 2 and KCl 40 mEq PO X 1  - will order MagSulfate 2 gm IV X 1   - will order Calcium gluconate 1 gm IVPB X 1   - recheck electrolytes on 9/29 @ 1400  Wendle Kina D ,PharmD Clinical Pharmacist 09/25/2023 6:59 AM

## 2023-09-25 NOTE — Progress Notes (Signed)
Dr Lindajo Royal notified by secure chat of potassium of 2.6

## 2023-09-25 NOTE — Discharge Instructions (Addendum)
Intensive Outpatient Programs   [facility]  [facility] [address]  [address] [place],  [place]  [place], [place] [phone]  [facility]  [facility] (Inpatient and outpatient)    [phone] (Suboxone and Methadone) [address] [phone]  [facility] - Intensive Outpatient [address]  [address] [place], [place] [address] [place], [place]  [phone]  [facility] (Outpatient, Inpatient, [facility]  [facility] (Groups and Residental) (insurance only) [phone] [place], [place]   [phone]   [facility] (for caregivers and family) [address]  [address] [place], [place]  [place], [place] [phone]  Residential Treatment Programs  [facility](2 years) Residential: 90 days)  [facility] ([facility]  [address] [place], [place]  [place], [place] [phone] or [phone]  [facility] [address] [place], [place]  [place], [place] [phone]  [facility] ([facility] [address] [place], [place] [address] [place], [place] [phone] Admissions: 8am-3pm M-F  [facility]: Residential Program [phone] Days)           [facility]: [facility]  [place], [place]  [place], [place]  [phone] or [phone] (Walk in Hours over the weekend or by referral)  [facility] [address] [place], [place] [address] [phone] (Do virtual or phone assessment, offer transportation within 25 miles, have in patient and Outpatient options)   [facility] Crisis:  [facility]:[phone] (for crisis response 24 hours a day)

## 2023-09-25 NOTE — Progress Notes (Signed)
Progress Note   Patient: Christine Schaefer:[id number] DOB: 05/18/1971 DOA: 09/24/2023     1 DOS: the patient was seen and examined on 09/25/2023     Subjective:  Patient seen and examined at bedside this morning Mental status significantly improved Denies nausea vomiting abdominal pain no chest pain Patient admits to using alcohol and she has been counseled extensively Observed to have some electrolyte derangement which is being repleted  Brief hospital course: From HPI "52 year old female with a past medical history of type 2 diabetes mellitus, daily alcohol use, hyperlipidemia and migraine headaches who presented to the emergency department after she had a loss of consciousness acutely at [employer] today no bladder or bowel loss of control although did bite tongue denies any prodrome palpitations or chest pain also previously an event [place] a casino [place]. The patient denies any localizing symptoms neurologically but does report generalized nausea and vomiting for the last 3 days with a few episodes of diarrhea which have resolved. Although cannot be confirmed in the EMR she reports her last A1c was 8 as outpatient and she takes metformin lisinopril 25 mg citalopram 25 mg on Mounjaro as outpatient and she currently is running blood glucoses in the 300s.  "  Assessment and Plan: Acute metabolic encephalopathy likely secondary to electrolyte derangement as well as hyperglycemia and metabolic acidosis CT head was unremarkable Neurologist was contacted emergency room and have agreed to see patient if there is an abnormality on MRI or EEG MRI report showing no acute intracranial pathology EEG report pending. Will continue glucose management as well as management of underlying electrolytes   Hypomagnesemia Hypokalemia Continue repletion and monitoring Likely secondary to alcohol use  Vitamin B12 deficiency Vitamin B12 of 169 Continue repletion and monitoring  Acid-base disorder,  mixed Acid-base disturbance, mixed Acid gnosis improved Continue to monitor BMP   Uncontrolled type 2 diabetes mellitus with hyperglycemia, with long-term current use of insulin (HCC) Advancement of diet Continue long-acting insulin as well as sliding scale Continue to monitor BMP   Abnormal ECG Prolonged QTc ECG 12-lead independently reviewed and interpreted QTc 527 with ventricular rate 112 sinus tachycardia without significant ST changes We will replete the patient's electrolytes and hold citalopram     Alcohol abuse Daily alcohol usage Patient has been counseled extensively about alcohol cessation   Thiamine deficiency   Suspected thiamine deficiency In the setting of alcohol usage Will provide empiric thiamine     GERD (gastroesophageal reflux disease) Gastroesophageal reflux disease Continue PPI   Mood disorder (HCC) Holding citalopram until electrolytes are resolved in the setting of prolonged QT interval     Essential hypertension Continue current antihypertensives   Transaminitis AST 76 Suspected the sequelae of fatty liver Patient counseled on alcohol cessation     Elevated lactic acid level   Elevated lactic acid 6.4, no suspicion of sepsis this is likely both from metformin as well as severe volume depletion due to osmotic diuresis we will monitor for any leukocytosis fever chills or signs or symptoms of localizing infection that would change this opinion, repeat lactic acid pending     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 This is likely secondary to volume depletion from osmotic diuresis      Advance Care Planning:   Code Status: Full Code   Consults: N/A   Family Communication: Updated husband Christine Schaefer via telephone [phone]    Physical Exam:  General: Laying in bed in no acute distress HEENT: Normocephalic/atraumatic Respiratory:     Clear  to auscultation bilaterally Cardiovascular: S1-S2 normal no murmurs Gastrointestinal:  Nontender no masses palpable Neurologic: Alert and awake oriented x 3 nonfocal   Data Reviewed: I have reviewed patient's previous records briefly, I have also reviewed admitting provider documentation, I have reviewed patient's CBC as well as CMP results as shown below, I have reviewed patient's CT scan of the brain that did not show any acute intracranial abnormality as well as MRI reports, I have also reviewed nursing documentation   Time spent: 55 minutes spent reviewing above data as well as taking care of patient.  More than 50% of this time was spent at bedside answering patient's questions as well as patient's husband's questions and counseling patient concerning alcohol cessation     Latest Ref Rng & Units 09/25/2023    4:02 PM 09/25/2023    2:14 PM 09/25/2023    4:24 AM  BMP  Glucose 70 - 99 mg/dL 629  528  413   BUN 6 - 20 mg/dL 13  14  15    Creatinine 0.44 - 1.00 mg/dL 2.44  0.10  2.72   Sodium 135 - 145 mmol/L 138  137  140   Potassium 3.5 - 5.1 mmol/L 3.5  3.8  2.6   Chloride 98 - 111 mmol/L 104  105  102   CO2 22 - 32 mmol/L 22  23  26    Calcium 8.9 - 10.3 mg/dL 7.2  7.2  6.9        Latest Ref Rng & Units 09/24/2023    1:34 PM 09/24/2023    9:45 AM  CBC  WBC 4.0 - 10.5 K/uL 10.0  7.7   Hemoglobin 12.0 - 15.0 g/dL 53.6  64.4   Hematocrit 36.0 - 46.0 % 38.4  41.3   Platelets 150 - 400 K/uL 180  164     Vitals:   09/25/23 0037 09/25/23 0408 09/25/23 0747 09/25/23 1148  BP: 139/72 (!) 161/82 (!) 154/80 (!) 163/83  Pulse: 81 85 77 73  Resp: 16 20 16 16   Temp: 98.2 F (36.8 C) 97.6 F (36.4 C) 98.1 F (36.7 C) 97.9 F (36.6 C)  TempSrc: Oral Oral    SpO2: 98% 97% 99% 100%  Weight:      Height:          Author: Loyce Dys, MD 09/25/2023 3:27 PM  For on call review [email or web address].

## 2023-09-25 NOTE — Consult Note (Addendum)
PHARMACY CONSULT NOTE - FOLLOW UP  Pharmacy Consult for Electrolyte Monitoring and Replacement   Recent Labs: Potassium (mmol/L)  Date Value  09/25/2023 3.8   Magnesium (mg/dL)  Date Value  16/09/9603 1.6 (L)   Calcium (mg/dL)  Date Value  54/08/8118 7.2 (L)   Albumin (g/dL)  Date Value  14/78/2956 3.4 (L)   Sodium (mmol/L)  Date Value  09/25/2023 137   Medications No current K wasting medications   Assessment: 9/29 @ 1414:  K = 3.8 Mg not rechecked  Goal of Therapy:  Electrolytes WNL   Plan:  No further repletion is necessary at this time.  Recheck BMP with AM labs. Also re-check Mg.   Effie Shy ,PharmD PGY1 Pharmacy Resident 09/25/2023 3:22 PM

## 2023-09-25 NOTE — Plan of Care (Signed)

## 2023-09-26 ENCOUNTER — Inpatient Hospital Stay (HOSPITAL_COMMUNITY): Admit: 2023-09-26 | Payer: PRIVATE HEALTH INSURANCE

## 2023-09-26 DIAGNOSIS — R569 Unspecified convulsions: Secondary | ICD-10-CM

## 2023-09-26 LAB — POTASSIUM: Potassium: 3.7 mmol/L (ref 3.5–5.1)

## 2023-09-26 LAB — BASIC METABOLIC PANEL
Anion gap: 12 (ref 5–15)
BUN: 10 mg/dL (ref 6–20)
CO2: 21 mmol/L — ABNORMAL LOW (ref 22–32)
Calcium: 7.2 mg/dL — ABNORMAL LOW (ref 8.9–10.3)
Chloride: 105 mmol/L (ref 98–111)
Creatinine, Ser: 0.6 mg/dL (ref 0.44–1.00)
GFR, Estimated: >60 mL/min (ref >60)
Glucose, Bld: 112 mg/dL — ABNORMAL HIGH (ref 70–99)
Potassium: 3 mmol/L — ABNORMAL LOW (ref 3.5–5.1)
Sodium: 138 mmol/L (ref 135–145)

## 2023-09-26 LAB — MAGNESIUM
Magnesium: 1.5 mg/dL — ABNORMAL LOW (ref 1.7–2.4)
Magnesium: 2.1 mg/dL (ref 1.7–2.4)

## 2023-09-26 LAB — CBC WITH DIFFERENTIAL/PLATELET
Abs Immature Granulocytes: 0.02 10*3/uL (ref 0.00–0.07)
Basophils Absolute: 0.1 10*3/uL (ref 0.0–0.1)
Basophils Relative: 1 %
Eosinophils Absolute: 0.2 10*3/uL (ref 0.0–0.5)
Eosinophils Relative: 4 %
HCT: 33.7 % — ABNORMAL LOW (ref 36.0–46.0)
Hemoglobin: 12 g/dL (ref 12.0–15.0)
Immature Granulocytes: 0 %
Lymphocytes Relative: 16 %
Lymphs Abs: 1 10*3/uL (ref 0.7–4.0)
MCH: 36 pg — ABNORMAL HIGH (ref 26.0–34.0)
MCHC: 35.6 g/dL (ref 30.0–36.0)
MCV: 101.2 fL — ABNORMAL HIGH (ref 80.0–100.0)
Monocytes Absolute: 0.4 10*3/uL (ref 0.1–1.0)
Monocytes Relative: 6 %
Neutro Abs: 4.7 10*3/uL (ref 1.7–7.7)
Neutrophils Relative %: 73 %
Platelets: 120 10*3/uL — ABNORMAL LOW (ref 150–400)
RBC: 3.33 MIL/uL — ABNORMAL LOW (ref 3.87–5.11)
RDW: 12.3 % (ref 11.5–15.5)
WBC: 6.3 10*3/uL (ref 4.0–10.5)
nRBC: 0 % (ref 0.0–0.2)

## 2023-09-26 LAB — CALCIUM, IONIZED: Calcium, Ionized, Serum: 4.1 mg/dL — ABNORMAL LOW (ref 4.5–5.6)

## 2023-09-26 LAB — GLUCOSE, CAPILLARY
Glucose-Capillary: 110 mg/dL — ABNORMAL HIGH (ref 70–99)
Glucose-Capillary: 142 mg/dL — ABNORMAL HIGH (ref 70–99)

## 2023-09-26 MED ORDER — POTASSIUM CHLORIDE CRYS ER 20 MEQ PO TBCR
40.0000 meq | EXTENDED_RELEASE_TABLET | ORAL | Status: AC
  Administered 2023-09-26 (×2): 40 meq via ORAL
  Filled 2023-09-26 (×2): qty 2

## 2023-09-26 MED ORDER — MAGNESIUM SULFATE 2 GM/50ML IV SOLN
2.0000 g | Freq: Once | INTRAVENOUS | Status: AC
  Administered 2023-09-26: 2 g via INTRAVENOUS
  Filled 2023-09-26: qty 50

## 2023-09-26 MED ORDER — HYDRALAZINE HCL 20 MG/ML IJ SOLN
10.0000 mg | INTRAMUSCULAR | Status: DC | PRN
  Administered 2023-09-26: 10 mg via INTRAVENOUS

## 2023-09-26 MED ORDER — CYANOCOBALAMIN 1000 MCG PO TABS: 1000.0000 ug | ORAL_TABLET | Freq: Every day | ORAL | 3 refills | Status: DC

## 2023-09-26 MED ORDER — FOLIC ACID 1 MG PO TABS: 1.0000 mg | ORAL_TABLET | Freq: Every day | ORAL | 0 refills | Status: DC

## 2023-09-26 MED ORDER — VITAMIN B-1 100 MG PO TABS: 100.0000 mg | ORAL_TABLET | Freq: Every day | ORAL | 0 refills | Status: DC

## 2023-09-26 MED ORDER — ADULT MULTIVITAMIN W/MINERALS CH: 1.0000 | ORAL_TABLET | Freq: Every day | ORAL | 0 refills | Status: DC

## 2023-09-26 MED ORDER — CALCIUM GLUCONATE-NACL 1-0.675 GM/50ML-% IV SOLN
1.0000 g | Freq: Once | INTRAVENOUS | Status: AC
  Administered 2023-09-26: 1000 mg via INTRAVENOUS
  Filled 2023-09-26: qty 50

## 2023-09-26 NOTE — Procedures (Signed)
Patient Name: NOMI RUDNICKI  MRN: [id number]  Epilepsy Attending: Charlsie Quest  Referring Physician/Provider: Merwyn Katos, MD  Date: 09/26/2023 Duration: 21.37 mins  Patient history: 52yo F with ams getting eeg to evaluate for seizure  Level of alertness: Awake  AEDs during EEG study: None  Technical aspects: This EEG study was done with scalp electrodes positioned according to the 10-20 International system of electrode placement. Electrical activity was reviewed with band pass filter of 1-70Hz , sensitivity of 7 uV/mm, display speed of 94mm/sec with a 60Hz  notched filter applied as appropriate. EEG data were recorded continuously and digitally stored.  Video monitoring was available and reviewed as appropriate.  Description: The posterior dominant rhythm consists of 9-10 Hz activity of moderate voltage (25-35 uV) seen predominantly in posterior head regions, symmetric and reactive to eye opening and eye closing. Hyperventilation did not show any EEG change.  Physiologic photic driving was not seen during photic stimulation.    IMPRESSION: This study is within normal limits. No seizures or epileptiform discharges were seen throughout the recording.  A normal interictal EEG does not exclude the diagnosis of epilepsy.  Endia Moncur Annabelle Harman

## 2023-09-26 NOTE — Progress Notes (Signed)
Introduced patient to role of Statistician.  Patient sitting up in bed, watching tv. Very pleasant. Patient states she works for [employer] and [employer]. She identifies her support system as Melvyn Novas, her significant other.   Patient endorses drinking alcohol "occasionally" and denies using illicit substances.   She is established with Jerl Mina, MD for primary care.   Denies any SDOH needs at present time.

## 2023-09-26 NOTE — Consult Note (Signed)
PHARMACY CONSULT NOTE - FOLLOW UP  Pharmacy Consult for Electrolyte Monitoring and Replacement   Recent Labs: Potassium (mmol/L)  Date Value  09/26/2023 3.0 (L)   Magnesium (mg/dL)  Date Value  16/09/9603 1.5 (L)   Calcium (mg/dL)  Date Value  54/08/8118 7.2 (L)   Albumin (g/dL)  Date Value  14/78/2956 3.4 (L)   Sodium (mmol/L)  Date Value  09/26/2023 138    Corrected Calcium: 7.7  Medications No current K wasting medications   Assessment: 52 yo female presented to the ED by EMS after being found unresponsive.  Concern for possible seizure.  Pharmacy consulted for electrolyte replacement  Goal of Therapy:  Electrolytes WNL   Plan:  K = 3.0, Give Kcl 40 mEq po q4h x 2 doses Mg = 1.5, Give MagSulf 2g IV x 1 Corrected calcium 7.7, give calcium gluconate 1 gram IV x 1 Recheck BMP with AM labs. Also check Mg and Phos.   Barrie Folk ,PharmD 09/26/2023 7:18 AM

## 2023-09-26 NOTE — Inpatient Diabetes Management (Addendum)
Inpatient Diabetes Program Recommendations  AACE/ADA: New Consensus Statement on Inpatient Glycemic Control (2015)  Target Ranges:  Prepandial:   less than 140 mg/dL      Peak postprandial:   less than 180 mg/dL (1-2 hours)      Critically ill patients:  140 - 180 mg/dL    Latest Reference Range & Units 09/24/23 09:45  Glucose 70 - 99 mg/dL 400 (H)  (H): Data is abnormally high  Latest Reference Range & Units 09/24/23 11:07 09/24/23 15:49  Beta-Hydroxybutyric Acid 0.05 - 0.27 mmol/L 2.42 (H) 0.62 (H)  (H): Data is abnormally high  Latest Reference Range & Units 09/24/23 13:34  Hemoglobin A1C 4.8 - 5.6 % 6.4 (H)  136 mg/dl  (H): Data is abnormally high  Latest Reference Range & Units 09/25/23 00:38 09/25/23 04:10 09/25/23 07:47 09/25/23 11:28 09/25/23 16:57 09/25/23 20:48  Glucose-Capillary 70 - 99 mg/dL 867 (H) 619 (H) 509 (H)   10 units Semglee @0855  135 (H)  1 unit Novolog  122 (H)  1 unit Novolog  101 (H)  (H): Data is abnormally high  Latest Reference Range & Units 09/26/23 08:16  Glucose-Capillary 70 - 99 mg/dL 326 (H)  10 units Semglee  (H): Data is abnormally high    Admit with:  Acute metabolic encephalopathy likely secondary to electrolyte derangement as well as hyperglycemia and metabolic acidosis   History: DM2, Alcohol Abuse  Home DM Meds: Metformin 1000 mg daily       Mounjaro 2.5 mg Qweek  Current Orders: Novolog Sensitive Correction Scale/ SSI (0-9 units) TID AC + HS     Semglee 10 units daily    MD- Note pt's current A1c of 6.4% shows good glucose control at home  Taking Metformin and Mounjaro at home  Since pt has History of Alcohol abuse and Had elevated Lactic acid on admission, recommend we Stop Metformin for home use until pt can follow up with PCP.  Mounjaro should be OK for pt to continue.   Addendum 11:45am--Met w/ pt prior to d/c.  Reviewed current A1c of 6.4% with pt.  Pt was very happy to hear her A1c was down to 6.4%--Told me her  last A1c in the low 9% range.  Has follow up appt with her PCP 10/04/2023.  Has traditional fingerstick CBG meter at home and checks CBGs TID AC at home.  Takes Metformin and Mounjaro regularly at home.  Has Rxs waiting for her at [employer] after she goes home.  We discussed her electrolyte abnormalities on arrival to the ED and all the treatments we have given her.  Pt told me she had vomiting for 1 week in the middle of Sept--Felt better for several days and then had vomiting start up again last Friday.  She does not think her Billie Lade is causing the vomiting b/c she has been taking the [facility] for over a year and has had very little nausea with the [facility]  Pt appreciative of all info and is hopeful for d/c today.      --Will follow patient during hospitalization--  Ambrose Finland RN, MSN, CDCES Diabetes Coordinator Inpatient Glycemic Control Team Team Pager: [phone] (8a-5p)

## 2023-09-26 NOTE — Discharge Summary (Signed)
Physician Discharge Summary   Patient: Christine Schaefer MRN: [id number] DOB: [birth year]  Admit date:     09/24/2023  Discharge date: 09/26/23  Discharge Physician: Loyce Dys   PCP: Jerl Mina, MD   Recommendations at discharge:  Follow-up with primary care physician  Discharge Diagnoses:  Acute metabolic encephalopathy likely secondary to electrolyte derangement as well as hyperglycemia and metabolic acidosis Hypomagnesemia Hypokalemia Vitamin B12 deficiency Acid-base disorder, mixed Uncontrolled type 2 diabetes mellitus with hyperglycemia, with long-term current use of insulin (HCC) Abnormal ECG Alcohol abuse GERD (gastroesophageal reflux disease) Mood disorder (HCC) Essential hypertension Transaminitis Elevated lactic acid level Polycythemia   Hospital Course: 52 year old female with a past medical history of type 2 diabetes mellitus, daily alcohol use, hyperlipidemia and migraine headaches who presented to the emergency department after she had a loss of consciousness acutely at [employer] today no bladder or bowel loss of control although did bite tongue denies any prodrome palpitations or chest pain also previously an event [place] a [place]. The patient denies any localizing symptoms neurologically but does report generalized nausea and vomiting for the last 3 days with a few episodes of diarrhea which have resolved.  On presentation patient was found to have metabolic acidosis as well as severe hypomagnesemia as well as hypokalemia.  Patient underwent IV fluid resuscitation as well as correction of electrolytes with improvement.  MRI of the brain did not show any acute intracranial abnormalities.  EEG did not show any acute seizures.  Patient has been counseled extensively about alcohol cessation.  She has an upcoming appointment with her primary care physician in few days time.  Since patient is back to her baseline she is therefore being discharged today to follow-up  with primary care physician.  Consultants: Neurology Procedures performed: EEG Disposition: Home Diet recommendation:  Cardiac diet DISCHARGE MEDICATION: Allergies as of 09/26/2023   No Known Allergies      Medication List     TAKE these medications    citalopram 40 MG tablet Commonly known as: CELEXA Take 40 mg by mouth daily.   cyanocobalamin 1000 MCG tablet Take 1 tablet (1,000 mcg total) by mouth daily. Start taking on: September 27, 2023   folic acid 1 MG tablet Commonly known as: FOLVITE Take 1 tablet (1 mg total) by mouth daily. Start taking on: September 27, 2023   lansoprazole 15 MG capsule Commonly known as: PREVACID Take by mouth.   lisinopril-hydrochlorothiazide 20-12.5 MG tablet Commonly known as: ZESTORETIC Take 2 tablets by mouth daily.   metFORMIN 500 MG 24 hr tablet Commonly known as: GLUCOPHAGE-XR Take 1,000 mg by mouth daily.   Mounjaro 2.5 MG/0.5ML Pen Generic drug: tirzepatide Inject 2.5 mg into the skin once a week.   multivitamin with minerals Tabs tablet Take 1 tablet by mouth daily. Start taking on: September 27, 2023   thiamine 100 MG tablet Commonly known as: Vitamin B-1 Take 1 tablet (100 mg total) by mouth daily. Start taking on: September 27, 2023        Follow-up Information     Jerl Mina, MD. Go on 10/04/2023.   Specialty: Family Medicine Why: @ 9am Contact information: [address] [place] [address] [phone]                Discharge Exam: Ceasar Mons Weights   09/24/23 1437 09/24/23 1501  Weight: 76.1 kg 73.5 kg   General: Laying in bed in no acute distress HEENT: Normocephalic/atraumatic Respiratory:  Clear to auscultation bilaterally Cardiovascular: S1-S2 normal no murmurs Gastrointestinal: Nontender no masses palpable Neurologic: Alert and awake oriented x 3 nonfocal  Condition at discharge: good  The results of significant diagnostics from this hospitalization (including  imaging, microbiology, ancillary and laboratory) are listed below for reference.   Imaging Studies: EEG adult  Result Date: 2023/10/03 Charlsie Quest, MD     03-Oct-2023  8:40 AM Patient Name: AMMANDA DOBBINS MRN: [id number] Epilepsy Attending: Charlsie Quest Referring Physician/Provider: Merwyn Katos, MD Date: Oct 03, 2023 Duration: 21.37 mins Patient history: 52yo F with ams getting eeg to evaluate for seizure Level of alertness: Awake AEDs during EEG study: None Technical aspects: This EEG study was done with scalp electrodes positioned according to the 10-20 International system of electrode placement. Electrical activity was reviewed with band pass filter of 1-70Hz , sensitivity of 7 uV/mm, display speed of 44mm/sec with a 60Hz  notched filter applied as appropriate. EEG data were recorded continuously and digitally stored.  Video monitoring was available and reviewed as appropriate. Description: The posterior dominant rhythm consists of 9-10 Hz activity of moderate voltage (25-35 uV) seen predominantly in posterior head regions, symmetric and reactive to eye opening and eye closing. Hyperventilation did not show any EEG change.  Physiologic photic driving was not seen during photic stimulation.  IMPRESSION: This study is within normal limits. No seizures or epileptiform discharges were seen throughout the recording. A normal interictal EEG does not exclude the diagnosis of epilepsy. Charlsie Quest   MR Brain W and Wo Contrast  Result Date: 09/24/2023 CLINICAL DATA:  Provided history: Seizure, new onset, no history of trauma. EXAM: MRI HEAD WITHOUT AND WITH CONTRAST TECHNIQUE: Multiplanar, multiecho pulse sequences of the brain and surrounding structures were obtained without and with intravenous contrast. CONTRAST:  6mL GADAVIST GADOBUTROL 1 MMOL/ML IV SOLN COMPARISON:  Non-contrast head CT 09/24/2023. FINDINGS: Brain: No age advanced or lobar predominant parenchymal atrophy. Multifocal T2 FLAIR  hyperintense signal abnormality within the cerebral white matter and pons, nonspecific but most often secondary to chronic small vessel ischemia. No cortical encephalomalacia is identified. No appreciable hippocampal size or signal asymmetry. There is no acute infarct. No evidence of an intracranial mass. No chronic intracranial blood products. No extra-axial fluid collection. No midline shift. No pathologic intracranial enhancement identified. Vascular: Maintained flow voids within the proximal large arterial vessels. Skull and upper cervical spine: Abnormal T1 hypointense marrow signal within the calvarium and visualized portions of the cervical spine. Sinuses/Orbits: No mass or acute finding within the imaged orbits. No significant paranasal sinus disease. Other: Trace fluid within the bilateral mastoid air cells. Redemonstrated right posterior scalp hematoma. IMPRESSION: 1. No evidence of an acute intracranial abnormality. 2. No specific seizure etiology is identified. 3. Mild multifocal T2 FLAIR hyperintense signal abnormality within the cerebral white matter and pons, nonspecific but most often secondary to chronic small vessel ischemia. 4. Abnormal T1 hypointense marrow signal within the calvarium and visualized upper cervical spine. While this finding can reflect a marrow infiltrative process, the most common causes include chronic anemia, smoking and obesity. 5. Redemonstrated right posterior scalp hematoma. Electronically Signed   By: Jackey Loge D.O.   On: 09/24/2023 14:49   CT Head Wo Contrast  Result Date: 09/24/2023 CLINICAL DATA:  52 year old female with new onset seizure. EXAM: CT HEAD WITHOUT CONTRAST TECHNIQUE: Contiguous axial images were obtained from the base of the skull through the vertex without intravenous contrast. RADIATION DOSE REDUCTION: This exam was performed according to the departmental  dose-optimization program which includes automated exposure control, adjustment of the mA  and/or kV according to patient size and/or use of iterative reconstruction technique. COMPARISON:  None Available. FINDINGS: Brain: Cerebral volume is probably within normal limits for age. No midline shift, ventriculomegaly, mass effect, evidence of mass lesion, intracranial hemorrhage or evidence of cortically based acute infarction. Gray-white matter differentiation is within normal limits for age throughout the brain. Vascular: No suspicious intracranial vascular hyperdensity. Calcified atherosclerosis at the skull base. Skull: Intact, negative. Sinuses/Orbits: Visualized paranasal sinuses and mastoids are clear. Other: Right vertex scalp hematoma (coronal image 40) up to 7 mm in thickness. Underlying calvarium intact. Visualized orbit soft tissues are within normal limits. IMPRESSION: 1. Right vertex scalp hematoma. No skull fracture. 2. Normal for age noncontrast CT appearance of the brain for age. Calcified atherosclerosis at the skull base. Electronically Signed   By: Odessa Fleming M.D.   On: 09/24/2023 10:08    Microbiology: No results found for this or any previous visit.  Labs: CBC: Recent Labs  Lab 09/24/23 0945 09/24/23 1334 09/26/23 0429  WBC 7.7 10.0 6.3  NEUTROABS 6.1  --  4.7  HGB 15.3* 14.2 12.0  HCT 41.3 38.4 33.7*  MCV 98.3 98.7 101.2*  PLT 164 180 120*   Basic Metabolic Panel: Recent Labs  Lab 09/24/23 1258 09/24/23 1334 09/24/23 1954 09/25/23 0424 09/25/23 1414 09/25/23 1602 09/26/23 0429 09/26/23 1222  NA  --    < > 137 140 137 138 138  --   K  --    < > 2.8* 2.6* 3.8 3.5 3.0* 3.7  CL  --    < > 100 102 105 104 105  --   CO2  --    < > 26 26 23 22  21*  --   GLUCOSE  --    < > 133* 104* 143* 130* 112*  --   BUN  --    < > 15 15 14 13 10   --   CREATININE  --    < > 0.80 0.69 0.65 0.68 0.60  --   CALCIUM  --    < > 7.3* 6.9* 7.2* 7.2* 7.2*  --   MG 1.0*  --  2.1 1.6*  --   --  1.5* 2.1   < > = values in this interval not displayed.   Liver Function  Tests: Recent Labs  Lab 09/24/23 0945 09/25/23 0424  AST 76* 86*  ALT 37 36  ALKPHOS 42 34*  BILITOT 1.5* 1.3*  PROT 7.9 6.2*  ALBUMIN 4.1 3.4*   CBG: Recent Labs  Lab 09/25/23 1128 09/25/23 1657 09/25/23 2048 09/26/23 0816 09/26/23 1130  GLUCAP 135* 122* 101* 110* 142*    Discharge time spent:  .  Signed: Loyce Dys, MD [facility] 09/26/2023

## 2023-09-26 NOTE — Progress Notes (Signed)
EEG complete - results pending 

## 2023-09-27 LAB — VITAMIN B1: Vitamin B1 (Thiamine): 158.9 nmol/L (ref 66.5–200.0)

## 2023-09-28 LAB — GLUTAMIC ACID DECARBOXYLASE AUTO ABS: Glutamic Acid Decarb Ab: <5 U/mL (ref 0.0–5.0)

## 2023-10-05 ENCOUNTER — Telehealth: Payer: Self-pay

## 2023-10-10 ENCOUNTER — Other Ambulatory Visit: Payer: Self-pay | Admitting: Family Medicine

## 2023-10-10 DIAGNOSIS — Z1231 Encounter for screening mammogram for malignant neoplasm of breast: Secondary | ICD-10-CM

## 2023-10-27 ENCOUNTER — Telehealth: Payer: Self-pay

## 2023-11-10 ENCOUNTER — Telehealth: Payer: Self-pay

## 2023-12-11 ENCOUNTER — Other Ambulatory Visit: Payer: Self-pay

## 2023-12-11 DIAGNOSIS — E876 Hypokalemia: Secondary | ICD-10-CM | POA: Diagnosis present

## 2023-12-11 DIAGNOSIS — K219 Gastro-esophageal reflux disease without esophagitis: Secondary | ICD-10-CM | POA: Diagnosis present

## 2023-12-11 DIAGNOSIS — F10139 Alcohol abuse with withdrawal, unspecified: Principal | ICD-10-CM | POA: Diagnosis present

## 2023-12-11 DIAGNOSIS — Z79899 Other long term (current) drug therapy: Secondary | ICD-10-CM

## 2023-12-11 DIAGNOSIS — Z7985 Long-term (current) use of injectable non-insulin antidiabetic drugs: Secondary | ICD-10-CM

## 2023-12-11 DIAGNOSIS — Z7984 Long term (current) use of oral hypoglycemic drugs: Secondary | ICD-10-CM

## 2023-12-11 DIAGNOSIS — I10 Essential (primary) hypertension: Secondary | ICD-10-CM | POA: Diagnosis present

## 2023-12-11 DIAGNOSIS — E1165 Type 2 diabetes mellitus with hyperglycemia: Secondary | ICD-10-CM | POA: Diagnosis present

## 2023-12-11 DIAGNOSIS — N179 Acute kidney failure, unspecified: Secondary | ICD-10-CM | POA: Diagnosis present

## 2023-12-11 DIAGNOSIS — R569 Unspecified convulsions: Secondary | ICD-10-CM | POA: Diagnosis present

## 2023-12-11 DIAGNOSIS — Z794 Long term (current) use of insulin: Secondary | ICD-10-CM

## 2023-12-11 DIAGNOSIS — Z1152 Encounter for screening for COVID-19: Secondary | ICD-10-CM

## 2023-12-11 NOTE — ED Triage Notes (Signed)
Pt reports body aches dizziness and high blood sugar for the past week, pt states last she checked her cbg was 200. Pt takes metformin at home, no insulin.

## 2023-12-12 ENCOUNTER — Emergency Department: Payer: Medicaid Other

## 2023-12-12 ENCOUNTER — Inpatient Hospital Stay
Admission: EM | Admit: 2023-12-12 | Discharge: 2023-12-14 | DRG: 897 | Disposition: A | Discharge status: Home / Self Care | Payer: Medicaid Other | Attending: Internal Medicine | Admitting: Internal Medicine

## 2023-12-12 ENCOUNTER — Inpatient Hospital Stay: Payer: Medicaid Other

## 2023-12-12 DIAGNOSIS — Z7984 Long term (current) use of oral hypoglycemic drugs: Secondary | ICD-10-CM | POA: Diagnosis not present

## 2023-12-12 DIAGNOSIS — R569 Unspecified convulsions: Secondary | ICD-10-CM

## 2023-12-12 DIAGNOSIS — E876 Hypokalemia: Secondary | ICD-10-CM | POA: Diagnosis present

## 2023-12-12 DIAGNOSIS — N179 Acute kidney failure, unspecified: Secondary | ICD-10-CM | POA: Diagnosis present

## 2023-12-12 DIAGNOSIS — E1165 Type 2 diabetes mellitus with hyperglycemia: Secondary | ICD-10-CM | POA: Diagnosis present

## 2023-12-12 DIAGNOSIS — K219 Gastro-esophageal reflux disease without esophagitis: Secondary | ICD-10-CM | POA: Diagnosis present

## 2023-12-12 DIAGNOSIS — Z1152 Encounter for screening for COVID-19: Secondary | ICD-10-CM | POA: Diagnosis not present

## 2023-12-12 DIAGNOSIS — E1169 Type 2 diabetes mellitus with other specified complication: Secondary | ICD-10-CM | POA: Diagnosis not present

## 2023-12-12 DIAGNOSIS — I10 Essential (primary) hypertension: Secondary | ICD-10-CM | POA: Diagnosis present

## 2023-12-12 DIAGNOSIS — F10930 Alcohol use, unspecified with withdrawal, uncomplicated: Secondary | ICD-10-CM

## 2023-12-12 DIAGNOSIS — Z794 Long term (current) use of insulin: Secondary | ICD-10-CM | POA: Diagnosis not present

## 2023-12-12 DIAGNOSIS — F10939 Alcohol use, unspecified with withdrawal, unspecified: Secondary | ICD-10-CM | POA: Diagnosis present

## 2023-12-12 DIAGNOSIS — F10139 Alcohol abuse with withdrawal, unspecified: Secondary | ICD-10-CM | POA: Diagnosis present

## 2023-12-12 DIAGNOSIS — Z7985 Long-term (current) use of injectable non-insulin antidiabetic drugs: Secondary | ICD-10-CM | POA: Diagnosis not present

## 2023-12-12 DIAGNOSIS — Z79899 Other long term (current) drug therapy: Secondary | ICD-10-CM | POA: Diagnosis not present

## 2023-12-12 LAB — MAGNESIUM: Magnesium: 1.3 mg/dL — ABNORMAL LOW (ref 1.7–2.4)

## 2023-12-12 LAB — HEPATIC FUNCTION PANEL
ALT: 46 U/L — ABNORMAL HIGH (ref 0–44)
AST: 54 U/L — ABNORMAL HIGH (ref 15–41)
Albumin: 4.8 g/dL (ref 3.5–5.0)
Alkaline Phosphatase: 47 U/L (ref 38–126)
Bilirubin, Direct: 0.4 mg/dL — ABNORMAL HIGH (ref 0.0–0.2)
Indirect Bilirubin: 1.1 mg/dL — ABNORMAL HIGH (ref 0.3–0.9)
Total Bilirubin: 1.5 mg/dL — ABNORMAL HIGH (ref <1.2)
Total Protein: 8.5 g/dL — ABNORMAL HIGH (ref 6.5–8.1)

## 2023-12-12 LAB — CBC WITH DIFFERENTIAL/PLATELET
Abs Immature Granulocytes: 0.07 10*3/uL (ref 0.00–0.07)
Basophils Absolute: 0 10*3/uL (ref 0.0–0.1)
Basophils Relative: 0 %
Eosinophils Absolute: 0.1 10*3/uL (ref 0.0–0.5)
Eosinophils Relative: 2 %
HCT: 38.8 % (ref 36.0–46.0)
Hemoglobin: 14 g/dL (ref 12.0–15.0)
Immature Granulocytes: 1 %
Lymphocytes Relative: 13 %
Lymphs Abs: 1 10*3/uL (ref 0.7–4.0)
MCH: 35.3 pg — ABNORMAL HIGH (ref 26.0–34.0)
MCHC: 36.1 g/dL — ABNORMAL HIGH (ref 30.0–36.0)
MCV: 97.7 fL (ref 80.0–100.0)
Monocytes Absolute: 0.5 10*3/uL (ref 0.1–1.0)
Monocytes Relative: 6 %
Neutro Abs: 6 10*3/uL (ref 1.7–7.7)
Neutrophils Relative %: 78 %
Platelets: 173 10*3/uL (ref 150–400)
RBC: 3.97 MIL/uL (ref 3.87–5.11)
RDW: 12.2 % (ref 11.5–15.5)
WBC: 7.8 10*3/uL (ref 4.0–10.5)
nRBC: 0 % (ref 0.0–0.2)

## 2023-12-12 LAB — CBG MONITORING, ED
Glucose-Capillary: 110 mg/dL — ABNORMAL HIGH (ref 70–99)
Glucose-Capillary: 149 mg/dL — ABNORMAL HIGH (ref 70–99)
Glucose-Capillary: 155 mg/dL — ABNORMAL HIGH (ref 70–99)
Glucose-Capillary: 160 mg/dL — ABNORMAL HIGH (ref 70–99)
Glucose-Capillary: 180 mg/dL — ABNORMAL HIGH (ref 70–99)
Glucose-Capillary: 238 mg/dL — ABNORMAL HIGH (ref 70–99)

## 2023-12-12 LAB — RESP PANEL BY RT-PCR (RSV, FLU A&B, COVID)  RVPGX2
Influenza A by PCR: NEGATIVE
Influenza B by PCR: NEGATIVE
Resp Syncytial Virus by PCR: NEGATIVE
SARS Coronavirus 2 by RT PCR: NEGATIVE

## 2023-12-12 LAB — BASIC METABOLIC PANEL
Anion gap: 15 (ref 5–15)
BUN: 36 mg/dL — ABNORMAL HIGH (ref 6–20)
CO2: 28 mmol/L (ref 22–32)
Calcium: 8.4 mg/dL — ABNORMAL LOW (ref 8.9–10.3)
Chloride: 93 mmol/L — ABNORMAL LOW (ref 98–111)
Creatinine, Ser: 1.77 mg/dL — ABNORMAL HIGH (ref 0.44–1.00)
GFR, Estimated: 34 mL/min — ABNORMAL LOW (ref >60)
Glucose, Bld: 224 mg/dL — ABNORMAL HIGH (ref 70–99)
Potassium: 3.2 mmol/L — ABNORMAL LOW (ref 3.5–5.1)
Sodium: 136 mmol/L (ref 135–145)

## 2023-12-12 LAB — CREATININE, URINE, RANDOM: Creatinine, Urine: 143 mg/dL

## 2023-12-12 LAB — AMMONIA: Ammonia: 102 umol/L — ABNORMAL HIGH (ref 9–35)

## 2023-12-12 LAB — SODIUM, URINE, RANDOM: Sodium, Ur: 71 mmol/L

## 2023-12-12 LAB — TSH: TSH: 0.853 u[IU]/mL (ref 0.350–4.500)

## 2023-12-12 LAB — LIPASE, BLOOD: Lipase: 45 U/L (ref 11–51)

## 2023-12-12 LAB — VITAMIN B12: Vitamin B-12: 782 pg/mL (ref 180–914)

## 2023-12-12 MED ORDER — LORAZEPAM 2 MG/ML IJ SOLN: 0.5000 mg | INTRAMUSCULAR | Status: DC | PRN

## 2023-12-12 MED ORDER — ADULT MULTIVITAMIN W/MINERALS CH
1.0000 | ORAL_TABLET | Freq: Every day | ORAL | Status: DC
  Administered 2023-12-12 – 2023-12-14 (×3): 1 via ORAL
  Filled 2023-12-12 (×3): qty 1

## 2023-12-12 MED ORDER — PHENOBARBITAL SODIUM 65 MG/ML IJ SOLN: 65.0000 mg | Freq: Once | INTRAMUSCULAR | Status: DC

## 2023-12-12 MED ORDER — POTASSIUM CHLORIDE CRYS ER 20 MEQ PO TBCR
40.0000 meq | EXTENDED_RELEASE_TABLET | Freq: Once | ORAL | Status: AC
  Administered 2023-12-12: 40 meq via ORAL
  Filled 2023-12-12: qty 2

## 2023-12-12 MED ORDER — LORAZEPAM 1 MG PO TABS: 1.0000 mg | ORAL_TABLET | ORAL | Status: DC | PRN

## 2023-12-12 MED ORDER — LACTATED RINGERS IV BOLUS
1000.0000 mL | Freq: Once | INTRAVENOUS | Status: AC
  Administered 2023-12-12: 1000 mL via INTRAVENOUS

## 2023-12-12 MED ORDER — MAGNESIUM SULFATE 4 GM/100ML IV SOLN
4.0000 g | Freq: Once | INTRAVENOUS | Status: AC
  Administered 2023-12-12: 4 g via INTRAVENOUS
  Filled 2023-12-12: qty 100

## 2023-12-12 MED ORDER — FOLIC ACID 1 MG PO TABS
1.0000 mg | ORAL_TABLET | Freq: Every day | ORAL | Status: DC
  Administered 2023-12-12 – 2023-12-14 (×3): 1 mg via ORAL
  Filled 2023-12-12 (×3): qty 1

## 2023-12-12 MED ORDER — INSULIN ASPART 100 UNIT/ML IJ SOLN
0.0000 [IU] | Freq: Three times a day (TID) | INTRAMUSCULAR | Status: DC
  Administered 2023-12-12 – 2023-12-13 (×2): 2 [IU] via SUBCUTANEOUS
  Administered 2023-12-13 – 2023-12-14 (×3): 1 [IU] via SUBCUTANEOUS
  Filled 2023-12-12 (×5): qty 1

## 2023-12-12 MED ORDER — ONDANSETRON HCL 4 MG/2ML IJ SOLN
4.0000 mg | Freq: Four times a day (QID) | INTRAMUSCULAR | Status: DC | PRN
Start: 2023-12-12 — End: 2023-12-14

## 2023-12-12 MED ORDER — THIAMINE MONONITRATE 100 MG PO TABS
100.0000 mg | ORAL_TABLET | Freq: Every day | ORAL | Status: DC
  Administered 2023-12-12 – 2023-12-14 (×3): 100 mg via ORAL
  Filled 2023-12-12 (×3): qty 1

## 2023-12-12 MED ORDER — ENOXAPARIN SODIUM 40 MG/0.4ML IJ SOSY
40.0000 mg | PREFILLED_SYRINGE | INTRAMUSCULAR | Status: DC
  Administered 2023-12-12 – 2023-12-14 (×3): 40 mg via SUBCUTANEOUS
  Filled 2023-12-12 (×3): qty 0.4

## 2023-12-12 MED ORDER — LORAZEPAM 2 MG/ML IJ SOLN
INTRAMUSCULAR | Status: AC
  Filled 2023-12-12: qty 1

## 2023-12-12 MED ORDER — LORAZEPAM 2 MG/ML IJ SOLN: 1.0000 mg | INTRAMUSCULAR | Status: DC | PRN

## 2023-12-12 MED ORDER — LACTULOSE 10 GM/15ML PO SOLN
30.0000 g | Freq: Three times a day (TID) | ORAL | Status: DC
  Administered 2023-12-13 (×3): 30 g via ORAL
  Filled 2023-12-12 (×4): qty 60

## 2023-12-12 MED ORDER — THIAMINE HCL 100 MG/ML IJ SOLN
100.0000 mg | Freq: Once | INTRAMUSCULAR | Status: AC
  Administered 2023-12-12: 100 mg via INTRAVENOUS
  Filled 2023-12-12: qty 2

## 2023-12-12 MED ORDER — THIAMINE HCL 100 MG/ML IJ SOLN: 100.0000 mg | Freq: Every day | INTRAMUSCULAR | Status: DC

## 2023-12-12 MED ORDER — RIFAXIMIN 550 MG PO TABS
550.0000 mg | ORAL_TABLET | Freq: Two times a day (BID) | ORAL | Status: DC
  Administered 2023-12-13 – 2023-12-14 (×4): 550 mg via ORAL
  Filled 2023-12-12 (×5): qty 1

## 2023-12-12 MED ORDER — PHENOBARBITAL SODIUM 65 MG/ML IJ SOLN
130.0000 mg | Freq: Once | INTRAMUSCULAR | Status: AC
  Administered 2023-12-12: 130 mg via INTRAVENOUS
  Filled 2023-12-12: qty 2

## 2023-12-12 MED ORDER — INSULIN ASPART 100 UNIT/ML IJ SOLN
3.0000 [IU] | Freq: Three times a day (TID) | INTRAMUSCULAR | Status: DC
  Administered 2023-12-12 – 2023-12-14 (×5): 3 [IU] via SUBCUTANEOUS
  Filled 2023-12-12 (×5): qty 1

## 2023-12-12 MED ORDER — ONDANSETRON HCL 4 MG PO TABS: 4.0000 mg | ORAL_TABLET | Freq: Four times a day (QID) | ORAL | Status: DC | PRN

## 2023-12-12 MED ORDER — ACETAMINOPHEN 325 MG PO TABS
650.0000 mg | ORAL_TABLET | Freq: Once | ORAL | Status: AC
  Administered 2023-12-12: 650 mg via ORAL
  Filled 2023-12-12: qty 2

## 2023-12-12 NOTE — Assessment & Plan Note (Signed)
Patient with reported drinking of roughly 1/5 of liquor daily with acute abstinence over the past 5 to 6 days and secondary alcohol withdrawal associated seizure Start CIWA protocol Start vitamin supplementation Patient interested in quitting Social work consult

## 2023-12-12 NOTE — Assessment & Plan Note (Signed)
PPI ?

## 2023-12-12 NOTE — ED Notes (Signed)
FSBS: 110 

## 2023-12-12 NOTE — H&P (Signed)
History and Physical    Patient: Christine Schaefer ZOX:[id number] DOB: 01-18-1971 DOA: 12/12/2023 DOS: the patient was seen and examined on 12/12/2023 PCP: Jerl Mina, MD  Patient coming from: Home  Chief Complaint:  Chief Complaint  Patient presents with   Dizziness   HPI: Christine Schaefer is a 52 y.o. female with medical history significant of alcohol abuse, hypertension, type 2 diabetes presenting with alcohol withdrawal seizure, and alcohol withdrawal.  Patient reports having generalized dizziness over the past 4 to 5 days.  Patient states she quit drinking abruptly roughly 4 to 5 days ago.  Baseline heavy alcohol use up till this point.  Drinking roughly 1/5 of liquor plus or minus wine every day.  Per the patient, she drank until she passed out.  No chest pain or shortness of breath.  Positive mild headache.  Positive mild paresthesias in lower extremity bilaterally.  No abdominal pain.  No nausea or vomiting. Presented to the ER afebrile, heart rate into the 120s, BP stable.  White count 7.8, hemoglobin 14, platelets 173, ammonia level 102.  Magnesium 1.3.  AST 54, ALT 46, T. bili 1.5.  Glucose 200s to 160s.  Creatinine 1.77.  Had witnessed GTC seizure lasting roughly 30 seconds in the ER.  Self resolved.  Status post phenobarbital.   Review of Systems: As mentioned in the history of present illness. All other systems reviewed and are negative. Past Medical History:  Diagnosis Date   Hypertension    Past Surgical History:  Procedure Laterality Date   EXPLORATORY LAPAROTOMY     Social History:  reports that she has never smoked. She has never used smokeless tobacco. She reports current alcohol use. She reports that she does not use drugs.  No Known Allergies  Family History  Problem Relation Age of Onset   Breast cancer Neg Hx     Prior to Admission medications   Medication Sig Start Date End Date Taking? Authorizing Provider  citalopram (CELEXA) 40 MG tablet  Take 40 mg by mouth daily.    [provider]  cyanocobalamin 1000 MCG tablet Take 1 tablet (1,000 mcg total) by mouth daily. 09/27/23   Loyce Dys, MD  folic acid (FOLVITE) 1 MG tablet Take 1 tablet (1 mg total) by mouth daily. 09/27/23   Loyce Dys, MD  lansoprazole (PREVACID) 15 MG capsule Take by mouth.    [provider]  lisinopril-hydrochlorothiazide (ZESTORETIC) 20-12.5 MG tablet Take 2 tablets by mouth daily.    [provider]  metFORMIN (GLUCOPHAGE-XR) 500 MG 24 hr tablet Take 1,000 mg by mouth daily. 09/19/23   [provider]  MOUNJARO 2.5 MG/0.5ML Pen Inject 2.5 mg into the skin once a week.    [provider]  Multiple Vitamin (MULTIVITAMIN WITH MINERALS) TABS tablet Take 1 tablet by mouth daily. 09/27/23   Loyce Dys, MD  thiamine (VITAMIN B-1) 100 MG tablet Take 1 tablet (100 mg total) by mouth daily. 09/27/23   Loyce Dys, MD    Physical Exam: Vitals:   12/11/23 2358 12/11/23 2359 12/12/23 0556  BP: (!) 147/98  129/78  Pulse: (!) 120  81  Resp: 18  18  Temp: 98.4 F (36.9 C)  98.4 F (36.9 C)  TempSrc: Oral  Oral  SpO2: 96%  98%  Weight:  71.7 kg   Height:  5\' 5"  (1.651 m)    Physical Exam HENT:     Head: Normocephalic and atraumatic.     Nose:  Nose normal.     Mouth/Throat:     Mouth: Mucous membranes are moist.  Eyes:     Pupils: Pupils are equal, round, and reactive to light.  Cardiovascular:     Rate and Rhythm: Normal rate and regular rhythm.  Pulmonary:     Effort: Pulmonary effort is normal.  Abdominal:     General: Bowel sounds are normal.  Musculoskeletal:        General: Normal range of motion.  Skin:    General: Skin is warm.  Neurological:     General: No focal deficit present.  Psychiatric:        Mood and Affect: Mood normal.     Data Reviewed:  There are no new results to review at this time.  CT HEAD WO CONTRAST ( ) CLINICAL DATA:  Generalized seizure.  EXAM: CT HEAD  WITHOUT CONTRAST  TECHNIQUE: Contiguous axial images were obtained from the base of the skull through the vertex without intravenous contrast.  RADIATION DOSE REDUCTION: This exam was performed according to the departmental dose-optimization program which includes automated exposure control, adjustment of the mA and/or kV according to patient size and/or use of iterative reconstruction technique.  COMPARISON:  Head CT and MRI 09/24/2023  FINDINGS: Brain: There is no evidence of an acute infarct, intracranial hemorrhage, mass, midline shift, or extra-axial fluid collection. Cerebral volume is within normal limits for age with normal size of the ventricles. Cerebral white matter hypodensities are nonspecific but compatible with mild chronic small vessel ischemic disease.  Vascular: Calcified atherosclerosis at the skull base. No hyperdense vessel.  Skull: No acute fracture or suspicious osseous lesion.  Sinuses/Orbits: Visualized paranasal sinuses and mastoid air cells are clear. Unremarkable orbits.  Other: None.  IMPRESSION: 1. No evidence of acute intracranial abnormality. 2. Mild chronic small vessel ischemic disease.  Electronically Signed   By: Sebastian Ache M.D.   On: 12/12/2023 07:27  Lab Results  Component Value Date   WBC 7.8 12/12/2023   HGB 14.0 12/12/2023   HCT 38.8 12/12/2023   MCV 97.7 12/12/2023   PLT 173 12/12/2023   Last metabolic panel Lab Results  Component Value Date   GLUCOSE 224 (H) 12/12/2023   NA 136 12/12/2023   K 3.2 (L) 12/12/2023   CL 93 (L) 12/12/2023   CO2 28 12/12/2023   BUN 36 (H) 12/12/2023   CREATININE 1.77 (H) 12/12/2023   GFRNONAA 34 (L) 12/12/2023   CALCIUM 8.4 (L) 12/12/2023   PROT 8.5 (H) 12/12/2023   ALBUMIN 4.8 12/12/2023   BILITOT 1.5 (H) 12/12/2023   ALKPHOS 47 12/12/2023   AST 54 (H) 12/12/2023   ALT 46 (H) 12/12/2023   ANIONGAP 15 12/12/2023    Assessment and Plan: * Alcohol withdrawal (HCC) Patient with  reported drinking of roughly 1/5 of liquor daily with acute abstinence over the past 5 to 6 days and secondary alcohol withdrawal associated seizure Start CIWA protocol Start vitamin supplementation Patient interested in quitting Social work consult  Seizure due to alcohol withdrawal (HCC) Noted generalized tonic-clonic seizure x 30 seconds in the ER with associated alcohol withdrawal-self resolving  Patient denies any prior episodes like this in the past CT head within normal limits Status post phenobarbital Will otherwise monitor for now As needed IV Ativan CIWA protocol Follow  AKI (acute kidney injury) (HCC) Cr 2.77 on presentation  Suspect prerenal etiology  Check FeNa Renal imaging  IVF hydration  Hold nephrotoxic agents  Trend Cr  Follow    Uncontrolled  type 2 diabetes mellitus with hyperglycemia, with long-term current use of insulin (HCC) Blood sugar in 200s SSI A1c Monitor  GERD (gastroesophageal reflux disease) PPI  Essential hypertension BP stable Titrate home regimen  Hypokalemia K 3.2  Check Mg level  Replete prn        Advance Care Planning:   Code Status: Full Code   Consults: None   Family Communication: No family at the bedside   Severity of Illness: The appropriate patient status for this patient is INPATIENT. Inpatient status is judged to be reasonable and necessary in order to provide the required intensity of service to ensure the patient's safety. The patient's presenting symptoms, physical exam findings, and initial radiographic and laboratory data in the context of their chronic comorbidities is felt to place them at high risk for further clinical deterioration. Furthermore, it is not anticipated that the patient will be medically stable for discharge from the hospital within 2 midnights of admission.   * I certify that at the point of admission it is my clinical judgment that the patient will require inpatient hospital care spanning  beyond 2 midnights from the point of admission due to high intensity of service, high risk for further deterioration and high frequency of surveillance required.*  Author: Floydene Flock, MD 12/12/2023 9:53 AM  For on call review [email or web address].

## 2023-12-12 NOTE — Assessment & Plan Note (Signed)
Blood sugar in 200s SSI A1c Monitor

## 2023-12-12 NOTE — Assessment & Plan Note (Signed)
K 3.2  Check Mg level  Replete prn

## 2023-12-12 NOTE — Progress Notes (Signed)
PHARMACY CONSULT NOTE - ELECTROLYTES  Pharmacy Consult for Electrolyte Monitoring and Replacement   Recent Labs: Height: 5\' 5"  (165.1 cm) Weight: 71.7 kg (158 lb) IBW/kg (Calculated) : 57 Estimated Creatinine Clearance: 36.1 mL/min (A) (by C-G formula based on SCr of 1.77 mg/dL (H)). Potassium (mmol/L)  Date Value  12/12/2023 3.2 (L)   Magnesium (mg/dL)  Date Value  29/56/2130 1.3 (L)   Calcium (mg/dL)  Date Value  86/57/8469 8.4 (L)   Albumin (g/dL)  Date Value  62/95/2841 4.8   Sodium (mmol/L)  Date Value  12/12/2023 136   Assessment  Christine Schaefer is a 52 y.o. female presenting with dizziness. PMH significant for T2DM and AUD. Pharmacy has been consulted to monitor and replace electrolytes.  Diet: carb modified MIVF: N/A Pertinent medications: N/A  Goal of Therapy: Electrolytes WNL  Plan:  Mg 1.3: give Magnesium sulfate 4 g IV x1 K 3.2: Give KCL 40 mEq PO x1 Check BMP, Mg, Phos with AM labs  Thank you for allowing pharmacy to be a part of this patient's care.  Rockwell Alexandria, PharmD Clinical Pharmacist 12/12/2023 9:48 AM

## 2023-12-12 NOTE — Assessment & Plan Note (Signed)
BP stable Titrate home regimen 

## 2023-12-12 NOTE — ED Provider Notes (Signed)
[facility] Provider Note    Event Date/Time   First MD Initiated Contact with Patient 12/12/23 [phone]     (approximate)   History   Dizziness   HPI  Christine Schaefer is a 52 y.o. female who presents to the ED for evaluation of Dizziness   I reviewed DC summary from a medical admission end of September.  She has a history of DM, ethanol abuse and migrainous headaches.  Patient presents for evaluation of dizziness the past few days after emesis earlier this week.  She reports 4 or 5 days ago she was unable to keep anything down.  Her initial history is fairly inconsistent and she thinks it is Friday and seems disoriented and relying on her husband for history.  Shortly after this, during my initial evaluation, I witnessed her having a generalized tonic-clonic seizure that lasted about 30 seconds.  Self resolved before nursing comparing Ativan into the room.  Subsequently postictal   Physical Exam   Triage Vital Signs: ED Triage Vitals  Encounter Vitals Group     BP 12/11/23 2358 (!) 147/98     Systolic BP Percentile --      Diastolic BP Percentile --      Pulse Rate 12/11/23 2358 (!) 120     Resp 12/11/23 2358 18     Temp 12/11/23 2358 98.4 F (36.9 C)     Temp Source 12/11/23 2358 Oral     SpO2 12/11/23 2358 96 %     Weight 12/11/23 2359 158 lb (71.7 kg)     Height 12/11/23 2359 5\' 5"  (1.651 m)     Head Circumference --      Peak Flow --      Pain Score 12/11/23 2359 5     Pain Loc --      Pain Education --      Exclude from Growth Chart --     Most recent vital signs: Vitals:   12/11/23 2358 12/12/23 0556  BP: (!) 147/98 129/78  Pulse: (!) 120 81  Resp: 18 18  Temp: 98.4 F (36.9 C) 98.4 F (36.9 C)  SpO2: 96% 98%    General: Awake, no distress.  Mildly disoriented.  Thinks it is Friday. CV:  Good peripheral perfusion.  Resp:  Normal effort.  Abd:  No distention.  Soft MSK:  No deformity noted.  Neuro:  No focal deficits  appreciated. Cranial nerves II through XII intact 5/5 strength and sensation in all 4 extremities Other:     ED Results / Procedures / Treatments   Labs (all labs ordered are listed, but only abnormal results are displayed) Labs Reviewed  CBC WITH DIFFERENTIAL/PLATELET - Abnormal; Notable for the following components:      Result Value   MCH 35.3 (*)    MCHC 36.1 (*)    All other components within normal limits  BASIC METABOLIC PANEL - Abnormal; Notable for the following components:   Potassium 3.2 (*)    Chloride 93 (*)    Glucose, Bld 224 (*)    BUN 36 (*)    Creatinine, Ser 1.77 (*)    Calcium 8.4 (*)    GFR, Estimated 34 (*)    All other components within normal limits  CBG MONITORING, ED - Abnormal; Notable for the following components:   Glucose-Capillary 238 (*)    All other components within normal limits  CBG MONITORING, ED - Abnormal; Notable for the following components:   Glucose-Capillary 160 (*)  All other components within normal limits  CBG MONITORING, ED - Abnormal; Notable for the following components:   Glucose-Capillary 155 (*)    All other components within normal limits  RESP PANEL BY RT-PCR (RSV, FLU A&B, COVID)  RVPGX2  MAGNESIUM  HEPATIC FUNCTION PANEL  LIPASE, BLOOD  AMMONIA    EKG Sinus tachycardia with rate of 113 bpm.  Normal axis and intervals.  No clear signs of acute ischemia.  RADIOLOGY   Official radiology report(s): No results found.  PROCEDURES and INTERVENTIONS:  .1-3 Lead EKG Interpretation  Performed by: Delton Prairie, MD Authorized by: Delton Prairie, MD     Interpretation: normal     ECG rate:  80   ECG rate assessment: normal     Rhythm: sinus rhythm     Ectopy: none     Conduction: normal   .Critical Care  Performed by: Delton Prairie, MD Authorized by: Delton Prairie, MD   Critical care provider statement:    Critical care time (minutes):  30   Critical care time was exclusive of:  Separately billable procedures  and treating other patients   Critical care was necessary to treat or prevent imminent or life-threatening deterioration of the following conditions:  CNS failure or compromise   Critical care was time spent personally by me on the following activities:  Development of treatment plan with patient or surrogate, discussions with consultants, evaluation of patient's response to treatment, examination of patient, ordering and review of laboratory studies, ordering and review of radiographic studies, ordering and performing treatments and interventions, pulse oximetry, re-evaluation of patient's condition and review of old charts   Medications  LORazepam (ATIVAN) 2 MG/ML injection (  Not Given 12/12/23 0639)  thiamine (VITAMIN B1) injection 100 mg (has no administration in time range)  lactated ringers bolus 1,000 mL (1,000 mLs Intravenous New Bag/Given 12/12/23 0639)  PHENObarbital (LUMINAL) injection 130 mg (130 mg Intravenous Given 12/12/23 0630)     IMPRESSION / MDM / ASSESSMENT AND PLAN / ED COURSE  I reviewed the triage vital signs and the nursing notes.  Differential diagnosis includes, but is not limited to,   {Patient presents with symptoms of an acute illness or injury that is potentially life-threatening.  Patient presents for presyncopal dizziness, likely related to an AKI from poor intake but then has a witnessed generalized tonic-clonic seizure in front of me concerning for alcohol withdrawal seizure after I get more history from patient and husband after this occurs.  Adding on LFTs and ammonia considering some presenting disorientation prior to the seizure.  AKI.  Normal CBC.  CT head is pending.  Signed out to oncoming morning physician to admit the patient after CT head  Clinical Course as of 12/12/23 6010  [facility] Dec 12, 2023  9323 During my initial evaluation of the patient, while chatting with her and her husband she is a generalized tonic-clonic seizure that lasted about 30  seconds and self resolves before nursing staff can bring Ativan to the room.  She is postictal, transiently hypoxic improved with jaw thrust and nonrebreather.  Husband provide some supplemental history outside the room after this occurs saying that she stopped cold [place] in the past 2 days. [DS]    Clinical Course User Index [DS] Delton Prairie, MD     FINAL CLINICAL IMPRESSION(S) / ED DIAGNOSES   Final diagnoses:  AKI (acute kidney injury) (HCC)  Alcohol withdrawal seizure without complication (HCC)     Rx / DC Orders   ED  Discharge Orders     None        Note:  This document was prepared using Dragon voice recognition software and may include unintentional dictation errors.   Delton Prairie, MD 12/12/23 [phone]

## 2023-12-12 NOTE — Assessment & Plan Note (Signed)
Cr 2.77 on presentation  Suspect prerenal etiology  Check FeNa Renal imaging  IVF hydration  Hold nephrotoxic agents  Trend Cr  Follow

## 2023-12-12 NOTE — Assessment & Plan Note (Addendum)
Noted generalized tonic-clonic seizure x 30 seconds in the ER with associated alcohol withdrawal-self resolving  Patient denies any prior episodes like this in the past CT head within normal limits Status post phenobarbital Will otherwise monitor for now As needed IV Ativan CIWA protocol Follow

## 2023-12-13 ENCOUNTER — Inpatient Hospital Stay: Payer: Medicaid Other

## 2023-12-13 ENCOUNTER — Ambulatory Visit: Payer: Medicaid Other

## 2023-12-13 DIAGNOSIS — F10930 Alcohol use, unspecified with withdrawal, uncomplicated: Secondary | ICD-10-CM | POA: Diagnosis not present

## 2023-12-13 DIAGNOSIS — R569 Unspecified convulsions: Secondary | ICD-10-CM | POA: Diagnosis not present

## 2023-12-13 DIAGNOSIS — N179 Acute kidney failure, unspecified: Secondary | ICD-10-CM | POA: Diagnosis not present

## 2023-12-13 LAB — COMPREHENSIVE METABOLIC PANEL
ALT: 38 U/L (ref 0–44)
AST: 39 U/L (ref 15–41)
Albumin: 3.7 g/dL (ref 3.5–5.0)
Alkaline Phosphatase: 36 U/L — ABNORMAL LOW (ref 38–126)
Anion gap: 15 (ref 5–15)
BUN: 29 mg/dL — ABNORMAL HIGH (ref 6–20)
CO2: 24 mmol/L (ref 22–32)
Calcium: 7.9 mg/dL — ABNORMAL LOW (ref 8.9–10.3)
Chloride: 100 mmol/L (ref 98–111)
Creatinine, Ser: 1.19 mg/dL — ABNORMAL HIGH (ref 0.44–1.00)
GFR, Estimated: 54 mL/min — ABNORMAL LOW (ref >60)
Glucose, Bld: 126 mg/dL — ABNORMAL HIGH (ref 70–99)
Potassium: 3 mmol/L — ABNORMAL LOW (ref 3.5–5.1)
Sodium: 139 mmol/L (ref 135–145)
Total Bilirubin: 1.2 mg/dL — ABNORMAL HIGH (ref <1.2)
Total Protein: 6.3 g/dL — ABNORMAL LOW (ref 6.5–8.1)

## 2023-12-13 LAB — CBG MONITORING, ED
Glucose-Capillary: 115 mg/dL — ABNORMAL HIGH (ref 70–99)
Glucose-Capillary: 177 mg/dL — ABNORMAL HIGH (ref 70–99)

## 2023-12-13 LAB — CBC
HCT: 30.8 % — ABNORMAL LOW (ref 36.0–46.0)
Hemoglobin: 11.3 g/dL — ABNORMAL LOW (ref 12.0–15.0)
MCH: 35.3 pg — ABNORMAL HIGH (ref 26.0–34.0)
MCHC: 36.7 g/dL — ABNORMAL HIGH (ref 30.0–36.0)
MCV: 96.3 fL (ref 80.0–100.0)
Platelets: 115 10*3/uL — ABNORMAL LOW (ref 150–400)
RBC: 3.2 MIL/uL — ABNORMAL LOW (ref 3.87–5.11)
RDW: 12 % (ref 11.5–15.5)
WBC: 4.4 10*3/uL (ref 4.0–10.5)
nRBC: 0 % (ref 0.0–0.2)

## 2023-12-13 LAB — GLUCOSE, CAPILLARY
Glucose-Capillary: 142 mg/dL — ABNORMAL HIGH (ref 70–99)
Glucose-Capillary: 155 mg/dL — ABNORMAL HIGH (ref 70–99)

## 2023-12-13 LAB — MAGNESIUM: Magnesium: 2.4 mg/dL (ref 1.7–2.4)

## 2023-12-13 LAB — PHOSPHORUS: Phosphorus: 5.3 mg/dL — ABNORMAL HIGH (ref 2.5–4.6)

## 2023-12-13 MED ORDER — POTASSIUM CHLORIDE CRYS ER 20 MEQ PO TBCR
40.0000 meq | EXTENDED_RELEASE_TABLET | Freq: Once | ORAL | Status: AC
  Administered 2023-12-13: 40 meq via ORAL
  Filled 2023-12-13: qty 2

## 2023-12-13 MED ORDER — GADOBUTROL 1 MMOL/ML IV SOLN
7.0000 mL | Freq: Once | INTRAVENOUS | Status: AC | PRN
  Administered 2023-12-13: 7 mL via INTRAVENOUS

## 2023-12-13 MED ORDER — POTASSIUM CHLORIDE CRYS ER 20 MEQ PO TBCR: 20.0000 meq | EXTENDED_RELEASE_TABLET | Freq: Once | ORAL | Status: DC

## 2023-12-13 NOTE — Procedures (Incomplete)
Patient Name: Christine Schaefer  MRN: [id number]  Epilepsy Attending: Charlsie Quest  Referring Physician/Provider: Loyce Dys, MD  Date: 12/13/2023  Duration:   Patient history: 52 y.o. female with medical history significant of alcohol abuse, hypertension, type 2 diabetes presenting with alcohol withdrawal seizure, and alcohol withdrawal. EEG to evaluate for seizure  Level of alertness: Awake, drowsy, sleep, comatose, lethargic ***  AEDs during EEG study: None  Technical aspects: This EEG study was done with scalp electrodes positioned according to the 10-20 International system of electrode placement. Electrical activity was reviewed with band pass filter of 1-70Hz , sensitivity of 7 uV/mm, display speed of 63mm/sec with a 60Hz  notched filter applied as appropriate. EEG data were recorded continuously and digitally stored.  Video monitoring was available and reviewed as appropriate.  Description: The posterior dominant rhythm consists of 9-10 Hz activity of moderate voltage (25-35 uV) seen predominantly in posterior head regions, symmetric and reactive to eye opening and eye closing. Drowsiness was characterized by attenuation of the posterior background rhythm. Sleep was characterized by vertex waves, sleep spindles (12 to 14 Hz), maximal frontocentral region.  There is an excessive amount of 15 to 18 Hz, 2-3 uV beta activity with irregular morphology distributed symmetrically and diffusely.   EEG showed continuous/intermittent generalized polymorphic sharply contoured 3 to 6 Hz theta-delta slowing.  EEG showed generalized periodic discharges with triphasic morphology at  Hz, more prominent when awake/stimulated.  Generalized Spike/Polyspikes/Sharp waves were noted in left/right frontal/temporal/parietal/occipital region.   Seizure was noted arising from left/right frontal/temporal/parietal/occipital region.  During seizure, patient was noted to.  Onset of seizure, total  duration  Event button was pressed on at for .  Concomitant EEG before, during and after the event showed normal posterior dominant rhythm, did not show any EEG changes suggest seizure.  Patient was noted to have episodes of brief sudden eye opening with whole body jerking every few seconds.  Concomitant EEG showed generalized polyspikes consistent with myoclonic seizures.  In between seizures EEG showed generalized background suppression.   Hyperventilation did not show any EEG change.  Physiologic photic driving was not seen during photic stimulation.  Hyperventilation and photic stimulation were not performed.     ABNORMALITY -Sharp wave, generalized, left/right frontal/temporal/parietal/occipital region.  -Spike,generalized, left/right frontal/temporal/parietal/occipital region.  -Polyspikes, generalized, left/right frontal/temporal/parietal/occipital region.  - Lateralized periodic discharges with overriding fast activity ( LPD +) left/right, maximal frontal/temporal/parietal/occipital region - Periodic discharges with triphasic morphology, generalized ( GPDs) - Intermittent slow, generalized - Continuous slow, generalized - Excessive beta, generalized    IMPRESSION: This study is within normal limits.  This study is suggestive of mild/moderate/severe diffuse encephalopathy, nonspecific etiology but likely related to sedation, toxic-metabolic etiology, anoxic/hypoxic brain injury This study is suggestive of cortical dysfunction arising from left/right frontal/temporal/parietal/occipital region, nonspecific etiology, likely secondary to underlying structural abnormality This study showed evidence of potential epileptogenicity arising from This study showed seizures arising from No seizures or epileptiform discharges were seen throughout the recording. However, only wakefulness and drowsiness were recorded. If suspicion for interictal activity remains a concern, a prolonged study  including sleep should be considered.  The excessive beta activity seen in the background is most likely due to the effect of benzodiazepine and is a benign EEG pattern. Patient was noted to have myoclonic seizures every few seconds.  Additionally there was evidence of severe to profound diffuse encephalopathy.  In the setting of cardiac arrest, this EEG pattern is suggestive of anoxic/hypoxic brain injury.  Dr. was notified.  A normal interictal EEG does not exclude nor support the diagnosis of epilepsy.   Christine Schaefer

## 2023-12-13 NOTE — Progress Notes (Addendum)
PHARMACY CONSULT NOTE - ELECTROLYTES  Pharmacy Consult for Electrolyte Monitoring and Replacement   Recent Labs: Height: 5\' 5"  (165.1 cm) Weight: 71.7 kg (158 lb) IBW/kg (Calculated) : 57 Estimated Creatinine Clearance: 53.7 mL/min (A) (by C-G formula based on SCr of 1.19 mg/dL (H)). Potassium (mmol/L)  Date Value  12/13/2023 3.0 (L)   Magnesium (mg/dL)  Date Value  25/36/6440 2.4   Calcium (mg/dL)  Date Value  34/74/2595 7.9 (L)   Albumin (g/dL)  Date Value  63/87/5643 3.7   Phosphorus (mg/dL)  Date Value  32/95/1884 5.3 (H)   Sodium (mmol/L)  Date Value  12/13/2023 139   Assessment  Christine Schaefer is a 52 y.o. female presenting with dizziness, ETOH w/d, seizure. PMH significant for ETOH, T2DM and AUD. Pharmacy has been consulted to monitor and replace electrolytes.  Diet: heart MIVF: N/A Pertinent medications: N/A  Goal of Therapy: Electrolytes WNL  Plan:  K 3.0: Give KCL 40 mEq PO x1  Phos 5.3: no replacement indicated at this time  Check BMP, Mg, Phos with AM labs  Thank you for allowing pharmacy to be a part of this patient's care.  Littie Deeds, PharmD Pharmacy Resident  12/13/2023 10:23 AM

## 2023-12-13 NOTE — Procedures (Signed)
Went to [id number] to do eeg. Pt was in mri.  Upon return from [facility] nurse informed me room 118 was ready for pt and if I could do eeg later.  Will get pt from room.

## 2023-12-13 NOTE — Progress Notes (Signed)
Attempted to introduce patient to role of nurse navigator. Patient currently in bathroom. Will return at a later time.

## 2023-12-13 NOTE — Progress Notes (Signed)
Progress Note   Patient: Christine Schaefer UXL:[id number] DOB: [birth year] DOA: 12/12/2023     1 DOS: the patient was seen and examined on 12/13/2023   Brief hospital course: Christine Schaefer is a 52 y.o. female with medical history significant of alcohol abuse, hypertension, type 2 diabetes presenting with alcohol withdrawal seizure, and alcohol withdrawal.  Patient reports having generalized dizziness over the past 4 to 5 days.  Patient states she quit drinking abruptly roughly 4 to 5 days ago.  Baseline heavy alcohol use up till this point.  Drinking roughly 1/5 of liquor plus or minus wine every day.  Per the patient, she drank until she passed out.  No chest pain or shortness of breath.  Positive mild headache.  Positive mild paresthesias in lower extremity bilaterally.  No abdominal pain.  No nausea or vomiting. Presented to the ER afebrile, heart rate into the 120s, BP stable.  White count 7.8, hemoglobin 14, platelets 173, ammonia level 102.  Magnesium 1.3.  AST 54, ALT 46, T. bili 1.5.  Glucose 200s to 160s.  Creatinine 1.77.  Had witnessed GTC seizure lasting roughly 30 seconds in the ER.  Self resolved.  Status post phenobarbital.  This is suspected to be due to alcohol withdrawal seizures however primary seizure workup underway.    Assessment and Plan:   Alcohol withdrawal (HCC) Patient with reported drinking of roughly 1/5 of liquor daily with acute abstinence over the past 5 to 6 days and secondary alcohol withdrawal associated seizure Continue CIWA protocol Continue vitamin supplementation Patient interested in quitting Social work consult   Seizure likely secondary to alcohol withdrawal (HCC) Noted generalized tonic-clonic seizure x 30 seconds in the ER with associated alcohol withdrawal-self resolving  Patient denies any prior episodes like this in the past CT head within normal limits Status post phenobarbital Will otherwise monitor for now As needed IV Ativan Obtain  MRI of the brain to rule out any focus of seizure Obtain EEG If above is positive consider neurology consultation No indication for antiepileptic drugs at this time   AKI (acute kidney injury) (HCC)-improved Cr 2.77 on presentation  Suspect prerenal etiology  Patient received IV fluid Continue oral hydration Monitor renal function     Uncontrolled type 2 diabetes mellitus with hyperglycemia, with long-term current use of insulin (HCC) Blood sugar in 200s Follow-up on hemoglobin A1c Continue current insulin regimen   GERD (gastroesophageal reflux disease) Continue PPI   Essential hypertension BP stable Titrate home regimen   Hypokalemia Continue repletion and monitoring      Advance Care Planning:   Code Status: Full Code    Consults: None    Family Communication: No family at the bedside    Subjective:  Patient seen and examined at bedside this morning Have not had any more seizures this morning Denies nausea vomiting Able to move all extremities Workup for primary seizures underway  Physical Exam: HENT:     Head: Normocephalic and atraumatic.     Nose: Nose normal.     Mouth/Throat:     Mouth: Mucous membranes are moist.  Eyes:     Pupils: Pupils are equal, round, and reactive to light.  Cardiovascular:     Rate and Rhythm: Normal rate and regular rhythm.  Pulmonary:     Effort: Pulmonary effort is normal.  Abdominal:     General: Bowel sounds are normal.  Musculoskeletal:        General: Normal range of motion.  Skin:  General: Skin is warm.  Neurological:     General: No focal deficit present.  Psychiatric:        Mood and Affect: Mood normal.   Vitals:   12/13/23 0607 12/13/23 1000 12/13/23 1137 12/13/23 1510  BP: 134/81 128/86 (!) 148/100 (!) 149/76  Pulse: 71 75 79 74  Resp: (!) 21 (!) 22 15 19   Temp: 98.8 F (37.1 C) 98.6 F (37 C) 98.2 F (36.8 C) 98 F (36.7 C)  TempSrc:  Oral Oral   SpO2: 100% 100% 100% 100%  Weight:       Height:        Data Reviewed:    Latest Ref Rng & Units 12/13/2023    5:34 AM 12/12/2023   12:01 AM 09/26/2023    4:29 AM  CBC  WBC 4.0 - 10.5 K/uL 4.4  7.8  6.3   Hemoglobin 12.0 - 15.0 g/dL 16.1  09.6  04.5   Hematocrit 36.0 - 46.0 % 30.8  38.8  33.7   Platelets 150 - 400 K/uL 115  173  120        Latest Ref Rng & Units 12/13/2023    5:34 AM 12/12/2023   12:01 AM 09/26/2023   12:22 PM  BMP  Glucose 70 - 99 mg/dL 409  811    BUN 6 - 20 mg/dL 29  36    Creatinine 9.14 - 1.00 mg/dL 7.82  9.56    Sodium 213 - 145 mmol/L 139  136    Potassium 3.5 - 5.1 mmol/L 3.0  3.2  3.7   Chloride 98 - 111 mmol/L 100  93    CO2 22 - 32 mmol/L 24  28    Calcium 8.9 - 10.3 mg/dL 7.9  8.4      Family Communication: No family at bedside  Disposition: Status is: Inpatient  Time spent: 56 minutes  Author: Loyce Dys, MD 12/13/2023 3:11 PM  For on call review [email or web address].

## 2023-12-13 NOTE — Progress Notes (Signed)
Eeg done 

## 2023-12-14 DIAGNOSIS — N179 Acute kidney failure, unspecified: Secondary | ICD-10-CM | POA: Diagnosis not present

## 2023-12-14 DIAGNOSIS — R569 Unspecified convulsions: Secondary | ICD-10-CM | POA: Diagnosis not present

## 2023-12-14 DIAGNOSIS — F10930 Alcohol use, unspecified with withdrawal, uncomplicated: Secondary | ICD-10-CM | POA: Diagnosis not present

## 2023-12-14 LAB — BASIC METABOLIC PANEL
Anion gap: 13 (ref 5–15)
BUN: 24 mg/dL — ABNORMAL HIGH (ref 6–20)
CO2: 23 mmol/L (ref 22–32)
Calcium: 8.6 mg/dL — ABNORMAL LOW (ref 8.9–10.3)
Chloride: 102 mmol/L (ref 98–111)
Creatinine, Ser: 1.02 mg/dL — ABNORMAL HIGH (ref 0.44–1.00)
GFR, Estimated: >60 mL/min (ref >60)
Glucose, Bld: 144 mg/dL — ABNORMAL HIGH (ref 70–99)
Potassium: 3.5 mmol/L (ref 3.5–5.1)
Sodium: 138 mmol/L (ref 135–145)

## 2023-12-14 LAB — CBC WITH DIFFERENTIAL/PLATELET
Abs Immature Granulocytes: 0.01 10*3/uL (ref 0.00–0.07)
Basophils Absolute: 0 10*3/uL (ref 0.0–0.1)
Basophils Relative: 1 %
Eosinophils Absolute: 0.2 10*3/uL (ref 0.0–0.5)
Eosinophils Relative: 4 %
HCT: 32.1 % — ABNORMAL LOW (ref 36.0–46.0)
Hemoglobin: 11.4 g/dL — ABNORMAL LOW (ref 12.0–15.0)
Immature Granulocytes: 0 %
Lymphocytes Relative: 26 %
Lymphs Abs: 1.4 10*3/uL (ref 0.7–4.0)
MCH: 34.8 pg — ABNORMAL HIGH (ref 26.0–34.0)
MCHC: 35.5 g/dL (ref 30.0–36.0)
MCV: 97.9 fL (ref 80.0–100.0)
Monocytes Absolute: 0.6 10*3/uL (ref 0.1–1.0)
Monocytes Relative: 11 %
Neutro Abs: 3.1 10*3/uL (ref 1.7–7.7)
Neutrophils Relative %: 58 %
Platelets: 122 10*3/uL — ABNORMAL LOW (ref 150–400)
RBC: 3.28 MIL/uL — ABNORMAL LOW (ref 3.87–5.11)
RDW: 12.1 % (ref 11.5–15.5)
WBC: 5.3 10*3/uL (ref 4.0–10.5)
nRBC: 0 % (ref 0.0–0.2)

## 2023-12-14 LAB — AMMONIA: Ammonia: 22 umol/L (ref 9–35)

## 2023-12-14 LAB — PHOSPHORUS: Phosphorus: 4.4 mg/dL (ref 2.5–4.6)

## 2023-12-14 LAB — MAGNESIUM: Magnesium: 1.9 mg/dL (ref 1.7–2.4)

## 2023-12-14 LAB — GLUCOSE, CAPILLARY
Glucose-Capillary: 126 mg/dL — ABNORMAL HIGH (ref 70–99)
Glucose-Capillary: 147 mg/dL — ABNORMAL HIGH (ref 70–99)

## 2023-12-14 MED ORDER — LISINOPRIL 20 MG PO TABS
40.0000 mg | ORAL_TABLET | Freq: Every day | ORAL | Status: DC
  Administered 2023-12-14: 40 mg via ORAL
  Filled 2023-12-14: qty 2

## 2023-12-14 MED ORDER — POTASSIUM CHLORIDE CRYS ER 20 MEQ PO TBCR
20.0000 meq | EXTENDED_RELEASE_TABLET | Freq: Once | ORAL | Status: AC
  Administered 2023-12-14: 20 meq via ORAL
  Filled 2023-12-14: qty 1

## 2023-12-14 NOTE — TOC CM/SW Note (Signed)
Transition of Care [facility]) - Inpatient Brief Assessment   Patient Details  Name: Christine Schaefer MRN: [id number] Date of Birth: [birth year]  Transition of Care [facility]) CM/SW Contact:    Allena Katz, LCSW Phone Number: 12/14/2023, 9:43 AM   Clinical Narrative:  Substance use resources added to AVS. No other TOC needs at this time.    Transition of Care Asessment: Insurance and Status: Insurance coverage has been reviewed Patient has primary care physician: Yes Home environment has been reviewed: with husband Prior level of function:: independent Prior/Current Home Services: No current home services Social Drivers of Health Review: SDOH reviewed no interventions necessary Readmission risk has been reviewed: Yes Transition of care needs: no transition of care needs at this time

## 2023-12-14 NOTE — Plan of Care (Signed)

## 2023-12-14 NOTE — Procedures (Signed)
Patient Name: Christine Schaefer  MRN: [id number]  Epilepsy Attending: Charlsie Quest  Referring Physician/Provider: Loyce Dys, MD  Date: 12/13/2023 Duration: 28.33 mins  Patient history: 52 year old female with history of alcohol use now with seizures likely in the setting of alcohol withdrawal.  EEG evaluate for seizures  Level of alertness: Awake  AEDs during EEG study: None  Technical aspects: This EEG study was done with scalp electrodes positioned according to the 10-20 International system of electrode placement. Electrical activity was reviewed with band pass filter of 1-70Hz , sensitivity of 7 uV/mm, display speed of 23mm/sec with a 60Hz  notched filter applied as appropriate. EEG data were recorded continuously and digitally stored.  Video monitoring was available and reviewed as appropriate.  Description: The posterior dominant rhythm consists of 8 Hz activity of moderate voltage (25-35 uV) seen predominantly in posterior head regions, symmetric and reactive to eye opening and eye closing. Hyperventilation and photic stimulation were not performed.     IMPRESSION: This study is within normal limits. No seizures or epileptiform discharges were seen throughout the recording.  A normal interictal EEG does not exclude the diagnosis of epilepsy.   Christine Schaefer Annabelle Harman

## 2023-12-14 NOTE — Progress Notes (Signed)
PHARMACY CONSULT NOTE - ELECTROLYTES  Pharmacy Consult for Electrolyte Monitoring and Replacement   Recent Labs: Height: 5\' 5"  (165.1 cm) Weight: 71.7 kg (158 lb) IBW/kg (Calculated) : 57 Estimated Creatinine Clearance: 62.6 mL/min (A) (by C-G formula based on SCr of 1.02 mg/dL (H)). Potassium (mmol/L)  Date Value  12/14/2023 3.5   Magnesium (mg/dL)  Date Value  95/62/1308 1.9   Calcium (mg/dL)  Date Value  65/78/4696 8.6 (L)   Albumin (g/dL)  Date Value  29/52/8413 3.7   Phosphorus (mg/dL)  Date Value  24/40/1027 4.4   Sodium (mmol/L)  Date Value  12/14/2023 138   Assessment  Christine Schaefer is a 52 y.o. female presenting with dizziness, ETOH w/d, seizure. PMH significant for ETOH, T2DM and AUD. Pharmacy has been consulted to monitor and replace electrolytes.  Diet: heart healthy MIVF: N/A Pertinent medications: N/A  Goal of Therapy: Electrolytes WNL  Plan:  K 3.5 - Will replace with Kcl po x 1 today Mg 1.9 - no replacement needed Phos 4.4 - no replacement needed Check BMP, Mg, Phos with AM labs  Thank you for allowing pharmacy to be a part of this patient's care.  Dewitt Judice Rodriguez-Guzman PharmD, BCPS 12/14/2023 7:52 AM

## 2023-12-14 NOTE — Discharge Instructions (Addendum)
Your Nurse Navigator can be reached at [phone]    Intensive Outpatient Programs   [facility] [address] [place],  [place], [place] [phone]  [facility] (Inpatient and outpatient)[phone] (Suboxone and Methadone) [address] [phone]  [facility] - Intensive Outpatient [address] [place], [place] [address]  [phone]  [facility] (Outpatient, Inpatient, [facility] (Groups and Residental) (insurance only) [phone] [place], [place] [phone]   [facility] (for caregivers and family) [address] Laurell Josephs [address] [place], [place] [phone]  Residential Treatment Programs  [facility](2 years) Residential: 61 days)[facility] [address] [facility] [address] [place], [place], [place] [phone] or [phone]  [facility] [address] [place], [place], [place] [phone]  [facility] ([facility] [address] [place], [place], [place] [phone] Admissions: 8am-3pm M-F  [facility] Program: Residential Program [phone] Days)             [facility]: [facility]  [place], [place], [place]  [phone] or [phone] in Hours over the weekend or by referral)  [facility] [address] [place], [place] [address] [phone] (Do virtual or phone assessment, offer transportation within 25 miles, have in patient and Outpatient options)   [employer]  Crisis: [facility]:[phone] (for crisis response 24 hours a day)

## 2023-12-14 NOTE — Discharge Summary (Signed)
Physician Discharge Summary   Patient: Christine Schaefer MRN: [id number] DOB: 1971/11/12  Admit date:     12/12/2023  Discharge date: 12/14/23  Discharge Physician: Loyce Dys   PCP: Jerl Mina, MD   Recommendations at discharge:   Follow-up with neurologist  Discharge Diagnoses: Alcohol withdrawal (HCC) Seizure likely secondary to alcohol withdrawal (HCC) AKI (acute kidney injury) (HCC)-improved Uncontrolled type 2 diabetes mellitus with hyperglycemia, with long-term current use of insulin (HCC) GERD (gastroesophageal reflux disease) Essential hypertension Hypokalemia    Hospital Course: Christine Schaefer is a 52 y.o. female with medical history significant of alcohol abuse, hypertension, type 2 diabetes presenting with alcohol withdrawal seizure, and alcohol withdrawal.  Patient reports having generalized dizziness over the past 4 to 5 days.  Patient states she quit drinking abruptly roughly 4 to 5 days ago.  Baseline heavy alcohol use up till this point.  Drinking roughly 1/5 of liquor plus or minus wine every day.  Per the patient, she drank until she passed out.  No chest pain or shortness of breath.  Positive mild headache.  Positive mild paresthesias in lower extremity bilaterally.  No abdominal pain.  Had witnessed GTC seizure lasting roughly 30 seconds in the ER.  Self resolved.  Status post phenobarbital.  This is suspected to be due to alcohol withdrawal seizures however primary seizure workup underway.  MRI of the brain as well as EEG was normal.  Case discussed with neurologist Dr. Lorrin Jackson and no indication for antiepileptic drugs.  This is likely due to alcohol withdrawal seizures as well as abnormal electrolytes.  Patient counseled concerning alcohol cessation and also to follow-up with outpatient neurologist   Consultants: Neurology Procedures performed: None Disposition: Home Diet recommendation:  Cardiac diet DISCHARGE MEDICATION: Allergies as of  12/14/2023   No Known Allergies      Medication List     TAKE these medications    citalopram 40 MG tablet Commonly known as: CELEXA Take 40 mg by mouth daily.   cyanocobalamin 1000 MCG tablet Take 1 tablet (1,000 mcg total) by mouth daily.   folic acid 1 MG tablet Commonly known as: FOLVITE Take 1 tablet (1 mg total) by mouth daily.   lansoprazole 15 MG capsule Commonly known as: PREVACID Take by mouth.   lisinopril-hydrochlorothiazide 20-12.5 MG tablet Commonly known as: ZESTORETIC Take 2 tablets by mouth daily.   metFORMIN 500 MG 24 hr tablet Commonly known as: GLUCOPHAGE-XR Take 1,000 mg by mouth daily.   Mounjaro 2.5 MG/0.5ML Pen Generic drug: tirzepatide Inject 2.5 mg into the skin once a week.   multivitamin with minerals Tabs tablet Take 1 tablet by mouth daily.   thiamine 100 MG tablet Commonly known as: Vitamin B-1 Take 1 tablet (100 mg total) by mouth daily.        Follow-up Information     Jerl Mina, MD. Go to.   Specialty: Family Medicine Why: Office will call and schedule follow up appt. Contact information: [address] [place] [address] [phone]                Discharge Exam: Ceasar Mons Weights   12/11/23 2359  Weight: 71.7 kg   HENT:     Head: Normocephalic and atraumatic.     Nose: Nose normal.     Mouth/Throat:     Mouth: Mucous membranes are moist.  Eyes:     Pupils: Pupils are equal, round, and reactive to light.  Cardiovascular:  Rate and Rhythm: Normal rate and regular rhythm.  Pulmonary:     Effort: Pulmonary effort is normal.  Abdominal:     General: Bowel sounds are normal.  Musculoskeletal:        General: Normal range of motion.  Skin:    General: Skin is warm.  Neurological:     General: No focal deficit present.  Psychiatric:        Mood and Affect: Mood normal.   Condition at discharge: good  The results of significant diagnostics from this hospitalization  (including imaging, microbiology, ancillary and laboratory) are listed below for reference.   Imaging Studies: EEG adult Result Date: 12/14/2023 Charlsie Quest, MD     12/14/2023  8:59 AM Patient Name: Christine Schaefer MRN: [id number] Epilepsy Attending: Charlsie Quest Referring Physician/Provider: Loyce Dys, MD Date: 12/13/2023 Duration: 28.33 mins Patient history: 52 year old female with history of alcohol use now with seizures likely in the setting of alcohol withdrawal.  EEG evaluate for seizures Level of alertness: Awake AEDs during EEG study: None Technical aspects: This EEG study was done with scalp electrodes positioned according to the 10-20 International system of electrode placement. Electrical activity was reviewed with band pass filter of 1-70Hz , sensitivity of 7 uV/mm, display speed of 45mm/sec with a 60Hz  notched filter applied as appropriate. EEG data were recorded continuously and digitally stored.  Video monitoring was available and reviewed as appropriate. Description: The posterior dominant rhythm consists of 8 Hz activity of moderate voltage (25-35 uV) seen predominantly in posterior head regions, symmetric and reactive to eye opening and eye closing. Hyperventilation and photic stimulation were not performed.   IMPRESSION: This study is within normal limits. No seizures or epileptiform discharges were seen throughout the recording. A normal interictal EEG does not exclude the diagnosis of epilepsy. Charlsie Quest   MR BRAIN W WO CONTRAST Result Date: 12/13/2023 CLINICAL DATA:  Head trauma, abnormal mental status (Age 71-64y). Dizziness, alcohol withdrawal, seizure. EXAM: MRI HEAD WITHOUT AND WITH CONTRAST TECHNIQUE: Multiplanar, multiecho pulse sequences of the brain and surrounding structures were obtained without and with intravenous contrast. CONTRAST:  7mL GADAVIST GADOBUTROL 1 MMOL/ML IV SOLN COMPARISON:  Head CT 12/12/2023.  MRI brain 09/24/2023. FINDINGS: Brain: No  acute infarct or hemorrhage. Stable mild chronic small-vessel disease. No hydrocephalus or extra-axial collection. No foci of abnormal susceptibility. No mass or abnormal enhancement. Symmetric size and signal of the hippocampi. No evidence of cortical dysgenesis. Vascular: Normal flow voids and vessel enhancement. Skull and upper cervical spine: Normal marrow signal and enhancement. Sinuses/Orbits: No acute findings. Other: None. IMPRESSION: 1. No acute intracranial abnormality or mass. No seizure focus identified. 2. Stable mild chronic small vessel disease. Electronically Signed   By: Orvan Falconer M.D.   On: 12/13/2023 12:33   CT ABDOMEN PELVIS WO CONTRAST Result Date: 12/12/2023 CLINICAL DATA:  Abdominal pain, acute, nonlocalized. EXAM: CT ABDOMEN AND PELVIS WITHOUT CONTRAST TECHNIQUE: Multidetector CT imaging of the abdomen and pelvis was performed following the standard protocol without IV contrast. RADIATION DOSE REDUCTION: This exam was performed according to the departmental dose-optimization program which includes automated exposure control, adjustment of the mA and/or kV according to patient size and/or use of iterative reconstruction technique. COMPARISON:  None Available. FINDINGS: Lower chest: There are subpleural atelectatic changes in the visualized lung bases. No overt consolidation. No pleural effusion. The heart is normal in size. No pericardial effusion. Hepatobiliary: The liver is normal in size. Non-cirrhotic configuration. There is an ill-defined, approximately  2.2 x 2.6 x 4.0 cm hypoattenuating lesion in the right hepatic lobe, segment 5/8 junction region. Lesion is incompletely characterized on the current examination but is new since the prior study from 02/14/2005. Further evaluation with MRI abdomen as per liver mass protocol is recommended. No intrahepatic or extrahepatic bile duct dilation. No calcified gallstones. Normal gallbladder wall thickness. No pericholecystic  inflammatory changes. Pancreas: Unremarkable. No pancreatic ductal dilatation or surrounding inflammatory changes. Spleen: Within normal limits. No focal lesion. Adrenals/Urinary Tract: Adrenal glands are unremarkable. No suspicious renal mass. No hydronephrosis. No renal or ureteric calculi. Bilateral renal vascular calcifications noted. Unremarkable urinary bladder. Stomach/Bowel: No disproportionate dilation of the small or large bowel loops. No evidence of abnormal bowel wall thickening or inflammatory changes. The appendix is unremarkable. Vascular/Lymphatic: No ascites or pneumoperitoneum. No abdominal or pelvic lymphadenopathy, by size criteria. No aneurysmal dilation of the major abdominal arteries. There are moderate peripheral atherosclerotic vascular calcifications of the aorta and its major branches. Reproductive: The uterus is unremarkable. No large adnexal mass. Other: Infraumbilical midline surgical scar noted. The soft tissues and abdominal wall are otherwise unremarkable. Musculoskeletal: No suspicious osseous lesions. There are mild multilevel degenerative changes in the visualized spine. IMPRESSION: *No inflammatory process identified within the abdomen or pelvis. *Multiple other nonacute observations, as described above. *There is a 2.2 x 2.6 x 4.0 cm indeterminate lesion in the right hepatic lobe. Further evaluation with MRI abdomen as per liver mass protocol is recommended. Aortic Atherosclerosis (ICD10-I70.0). Electronically Signed   By: Jules Schick M.D.   On: 12/12/2023 12:05   CT HEAD WO CONTRAST ( ) Result Date: 12/12/2023 CLINICAL DATA:  Generalized seizure. EXAM: CT HEAD WITHOUT CONTRAST TECHNIQUE: Contiguous axial images were obtained from the base of the skull through the vertex without intravenous contrast. RADIATION DOSE REDUCTION: This exam was performed according to the departmental dose-optimization program which includes automated exposure control, adjustment of the mA  and/or kV according to patient size and/or use of iterative reconstruction technique. COMPARISON:  Head CT and MRI 09/24/2023 FINDINGS: Brain: There is no evidence of an acute infarct, intracranial hemorrhage, mass, midline shift, or extra-axial fluid collection. Cerebral volume is within normal limits for age with normal size of the ventricles. Cerebral white matter hypodensities are nonspecific but compatible with mild chronic small vessel ischemic disease. Vascular: Calcified atherosclerosis at the skull base. No hyperdense vessel. Skull: No acute fracture or suspicious osseous lesion. Sinuses/Orbits: Visualized paranasal sinuses and mastoid air cells are clear. Unremarkable orbits. Other: None. IMPRESSION: 1. No evidence of acute intracranial abnormality. 2. Mild chronic small vessel ischemic disease. Electronically Signed   By: Sebastian Ache M.D.   On: 12/12/2023 07:27    Microbiology: Results for orders placed or performed during the hospital encounter of 12/12/23  Resp panel by RT-PCR (RSV, Flu A&B, Covid) Anterior Nasal Swab     Status: None   Collection Time: 12/12/23 12:01 AM   Specimen: Anterior Nasal Swab  Result Value Ref Range Status   SARS Coronavirus 2 by RT PCR NEGATIVE NEGATIVE Final    Comment: (NOTE) SARS-CoV-2 target nucleic acids are NOT DETECTED.  The SARS-CoV-2 RNA is generally detectable in upper respiratory specimens during the acute phase of infection. The lowest concentration of SARS-CoV-2 viral copies this assay can detect is 138 copies/mL. A negative result does not preclude SARS-Cov-2 infection and should not be used as the sole basis for treatment or other patient management decisions. A negative result may occur with  improper specimen collection/handling, submission of  specimen other than nasopharyngeal swab, presence of viral mutation(s) within the areas targeted by this assay, and inadequate number of viral copies(<138 copies/mL). A negative result must be  combined with clinical observations, patient history, and epidemiological information. The expected result is Negative.  Fact Sheet for Patients:  BloggerCourse.com  Fact Sheet for Healthcare Providers:  SeriousBroker.it  This test is no t yet approved or cleared by the [place] FDA and  has been authorized for detection and/or diagnosis of SARS-CoV-2 by FDA under an Emergency Use Authorization (EUA). This EUA will remain  in effect (meaning this test can be used) for the duration of the COVID-19 declaration under Section 564(b)(1) of the Act, 21 U.S.C.section 360bbb-3(b)(1), unless the authorization is terminated  or revoked sooner.       Influenza A by PCR NEGATIVE NEGATIVE Final   Influenza B by PCR NEGATIVE NEGATIVE Final    Comment: (NOTE) The Xpert Xpress SARS-CoV-2/FLU/RSV plus assay is intended as an aid in the diagnosis of influenza from Nasopharyngeal swab specimens and should not be used as a sole basis for treatment. Nasal washings and aspirates are unacceptable for Xpert Xpress SARS-CoV-2/FLU/RSV testing.  Fact Sheet for Patients: BloggerCourse.com  Fact Sheet for Healthcare Providers: SeriousBroker.it  This test is not yet approved or cleared by the [place] FDA and has been authorized for detection and/or diagnosis of SARS-CoV-2 by FDA under an Emergency Use Authorization (EUA). This EUA will remain in effect (meaning this test can be used) for the duration of the COVID-19 declaration under Section 564(b)(1) of the Act, 21 U.S.C. section 360bbb-3(b)(1), unless the authorization is terminated or revoked.     Resp Syncytial Virus by PCR NEGATIVE NEGATIVE Final    Comment: (NOTE) Fact Sheet for Patients: BloggerCourse.com  Fact Sheet for Healthcare Providers: SeriousBroker.it  This test is not yet  approved or cleared by the [place] FDA and has been authorized for detection and/or diagnosis of SARS-CoV-2 by FDA under an Emergency Use Authorization (EUA). This EUA will remain in effect (meaning this test can be used) for the duration of the COVID-19 declaration under Section 564(b)(1) of the Act, 21 U.S.C. section 360bbb-3(b)(1), unless the authorization is terminated or revoked.  Performed at [facility], [address] [place], [place] [address]     Labs: CBC: Recent Labs  Lab 12/12/23 0001 12/13/23 0534 12/14/23 0620  WBC 7.8 4.4 5.3  NEUTROABS 6.0  --  3.1  HGB 14.0 11.3* 11.4*  HCT 38.8 30.8* 32.1*  MCV 97.7 96.3 97.9  PLT 173 115* 122*   Basic Metabolic Panel: Recent Labs  Lab 12/12/23 0001 12/12/23 0625 12/13/23 0534 12/14/23 0620  NA 136  --  139 138  K 3.2*  --  3.0* 3.5  CL 93*  --  100 102  CO2 28  --  24 23  GLUCOSE 224*  --  126* 144*  BUN 36*  --  29* 24*  CREATININE 1.77*  --  1.19* 1.02*  CALCIUM 8.4*  --  7.9* 8.6*  MG  --  1.3* 2.4 1.9  PHOS  --   --  5.3* 4.4   Liver Function Tests: Recent Labs  Lab 12/12/23 0625 12/13/23 0534  AST 54* 39  ALT 46* 38  ALKPHOS 47 36*  BILITOT 1.5* 1.2*  PROT 8.5* 6.3*  ALBUMIN 4.8 3.7   CBG: Recent Labs  Lab 12/13/23 1124 12/13/23 1625 12/13/23 2259 12/14/23 0800 12/14/23 1229  GLUCAP 177* 142* 155* 126* 147*  Discharge time spent:  33 minutes.  Signed: Loyce Dys, MD [facility] 12/14/2023

## 2024-05-17 ENCOUNTER — Emergency Department

## 2024-05-17 ENCOUNTER — Other Ambulatory Visit: Payer: Self-pay

## 2024-05-17 ENCOUNTER — Emergency Department
Admission: EM | Admit: 2024-05-17 | Discharge: 2024-05-17 | Disposition: A | Discharge status: Home / Self Care | Attending: Emergency Medicine | Admitting: Emergency Medicine

## 2024-05-17 DIAGNOSIS — K2900 Acute gastritis without bleeding: Secondary | ICD-10-CM | POA: Insufficient documentation

## 2024-05-17 DIAGNOSIS — Y908 Blood alcohol level of 240 mg/100 ml or more: Secondary | ICD-10-CM | POA: Diagnosis not present

## 2024-05-17 DIAGNOSIS — I10 Essential (primary) hypertension: Secondary | ICD-10-CM | POA: Insufficient documentation

## 2024-05-17 DIAGNOSIS — E119 Type 2 diabetes mellitus without complications: Secondary | ICD-10-CM | POA: Diagnosis not present

## 2024-05-17 DIAGNOSIS — R0789 Other chest pain: Secondary | ICD-10-CM

## 2024-05-17 DIAGNOSIS — R079 Chest pain, unspecified: Secondary | ICD-10-CM | POA: Diagnosis present

## 2024-05-17 LAB — COMPREHENSIVE METABOLIC PANEL WITH GFR
ALT: 33 U/L (ref 0–44)
AST: 55 U/L — ABNORMAL HIGH (ref 15–41)
Albumin: 4.3 g/dL (ref 3.5–5.0)
Alkaline Phosphatase: 45 U/L (ref 38–126)
Anion gap: 20 — ABNORMAL HIGH (ref 5–15)
BUN: 14 mg/dL (ref 6–20)
CO2: 21 mmol/L — ABNORMAL LOW (ref 22–32)
Calcium: 9.1 mg/dL (ref 8.9–10.3)
Chloride: 101 mmol/L (ref 98–111)
Creatinine, Ser: 0.7 mg/dL (ref 0.44–1.00)
GFR, Estimated: >60 mL/min (ref >60)
Glucose, Bld: 127 mg/dL — ABNORMAL HIGH (ref 70–99)
Potassium: 3.4 mmol/L — ABNORMAL LOW (ref 3.5–5.1)
Sodium: 142 mmol/L (ref 135–145)
Total Bilirubin: 0.6 mg/dL (ref 0.0–1.2)
Total Protein: 7.4 g/dL (ref 6.5–8.1)

## 2024-05-17 LAB — TROPONIN I (HIGH SENSITIVITY): Troponin I (High Sensitivity): 10 ng/L (ref <18)

## 2024-05-17 LAB — CBC
HCT: 38 % (ref 36.0–46.0)
Hemoglobin: 13.5 g/dL (ref 12.0–15.0)
MCH: 32.4 pg (ref 26.0–34.0)
MCHC: 35.5 g/dL (ref 30.0–36.0)
MCV: 91.1 fL (ref 80.0–100.0)
Platelets: 215 10*3/uL (ref 150–400)
RBC: 4.17 MIL/uL (ref 3.87–5.11)
RDW: 14.1 % (ref 11.5–15.5)
WBC: 6.9 10*3/uL (ref 4.0–10.5)
nRBC: 0 % (ref 0.0–0.2)

## 2024-05-17 LAB — ETHANOL: Alcohol, Ethyl (B): 339 mg/dL (ref <15)

## 2024-05-17 MED ORDER — ONDANSETRON 4 MG PO TBDP: 4.0000 mg | ORAL_TABLET | Freq: Three times a day (TID) | ORAL | 0 refills | Status: DC | PRN

## 2024-05-17 MED ORDER — ONDANSETRON HCL 4 MG/2ML IJ SOLN
4.0000 mg | Freq: Once | INTRAMUSCULAR | Status: AC
  Administered 2024-05-17: 4 mg via INTRAVENOUS
  Filled 2024-05-17: qty 2

## 2024-05-17 MED ORDER — MORPHINE SULFATE (PF) 4 MG/ML IV SOLN
4.0000 mg | Freq: Once | INTRAVENOUS | Status: AC
  Administered 2024-05-17: 4 mg via INTRAVENOUS
  Filled 2024-05-17: qty 1

## 2024-05-17 MED ORDER — SODIUM CHLORIDE 0.9 % IV SOLN: 12.5000 mg | Freq: Once | INTRAVENOUS | Status: DC

## 2024-05-17 MED ORDER — SODIUM CHLORIDE 0.9 % IV BOLUS
500.0000 mL | Freq: Once | INTRAVENOUS | Status: AC
  Administered 2024-05-17: 500 mL via INTRAVENOUS

## 2024-05-17 NOTE — ED Notes (Signed)
 This RN called lab for update on Trop. Lab added on trop and is running now.

## 2024-05-17 NOTE — ED Provider Notes (Signed)
 [facility] Provider Note    Event Date/Time   First MD Initiated Contact with Patient 05/17/24 [phone]     (approximate)   History   Chest Pain, Shortness of Breath, and Nausea   HPI  Jacolyn Joaquin is a 53 y.o. female with a history of hypertension, diabetes, alcohol abuse, alcohol withdrawal with seizures presents with complaints of chest pain, nausea which began this morning.  She describes lower chest discomfort with nausea and vomiting.  No fevers.  No cough.     Physical Exam   Triage Vital Signs: ED Triage Vitals  Encounter Vitals Group     BP 05/17/24 0745 (!) 140/116     Systolic BP Percentile --      Diastolic BP Percentile --      Pulse Rate 05/17/24 0745 (!) 112     Resp 05/17/24 0745 (!) 21     Temp 05/17/24 0745 98.4 F (36.9 C)     Temp Source 05/17/24 0745 Oral     SpO2 05/17/24 0745 100 %     Weight 05/17/24 0747 70 kg (154 lb 4.8 oz)     Height 05/17/24 0747 1.651 m (5\' 5" )     Head Circumference --      Peak Flow --      Pain Score 05/17/24 0746 5     Pain Loc --      Pain Education --      Exclude from Growth Chart --     Most recent vital signs: Vitals:   05/17/24 1144 05/17/24 1144  BP: (!) 175/79   Pulse: 94   Resp: 19   Temp:  98.6 F (37 C)  SpO2: 99%      General: Awake, gagging into emesis bag CV:  Good peripheral perfusion.  Tachycardia Resp:  Normal effort.  Tachypnea, clear to auscultation Abd:  No distention.  Other:     ED Results / Procedures / Treatments   Labs (all labs ordered are listed, but only abnormal results are displayed) Labs Reviewed  COMPREHENSIVE METABOLIC PANEL WITH GFR - Abnormal; Notable for the following components:      Result Value   Potassium 3.4 (*)    CO2 21 (*)    Glucose, Bld 127 (*)    AST 55 (*)    Anion gap 20 (*)    All other components within normal limits  ETHANOL - Abnormal; Notable for the following components:   Alcohol, Ethyl (B) 339 (*)    All  other components within normal limits  CBC  TROPONIN I (HIGH SENSITIVITY)     EKG  ED ECG REPORT I, Bryson Carbine, the attending physician, personally viewed and interpreted this ECG.  Date: 05/17/2024  Rhythm: Sinus tachycardia QRS Axis: normal Intervals: normal ST/T Wave abnormalities: normal Narrative Interpretation: Limited by patient movement    RADIOLOGY Chest x-ray viewed interpret by me, no acute abnormality    PROCEDURES:  Critical Care performed:   Procedures   MEDICATIONS ORDERED IN ED: Medications  ondansetron  (ZOFRAN ) injection 4 mg (4 mg Intravenous Given 05/17/24 0802)  morphine (PF) 4 MG/ML injection 4 mg (4 mg Intravenous Given 05/17/24 0802)  sodium chloride  0.9 % bolus 500 mL (0 mLs Intravenous Stopped 05/17/24 1143)  ondansetron  (ZOFRAN ) injection 4 mg (4 mg Intravenous Given 05/17/24 1142)     IMPRESSION / MDM / ASSESSMENT AND PLAN / ED COURSE  I reviewed the triage vital signs and the nursing notes. Patient's presentation  is most consistent with acute presentation with potential threat to life or bodily function.  Patient presents with chest pain as above, she is mildly hypertensive, tachycardic with mild tachypnea.  Differential includes ACS, alcohol withdrawal, pneumonia, electrolyte abnormalities  Will treat with IV morphine, IV Zofran , obtain labs including high sensitive troponin, chest x-ray and monitor carefully.  Lab work reviewed and is quite reassuring.  Patient required a second dose of IV Zofran  for continued mild nausea however pain is resolved.   ----------------------------------------- 11:59 AM on 05/17/2024 ----------------------------------------- Patient reports he is feeling much better and is ready for discharge, I considered admission however given that she is now asymptomatic with a reassuring workup I have asked her to follow-up closely with her PCP, strict return precautions, she agrees to this plan      FINAL  CLINICAL IMPRESSION(S) / ED DIAGNOSES   Final diagnoses:  Acute gastritis without hemorrhage, unspecified gastritis type  Atypical chest pain     Rx / DC Orders   ED Discharge Orders          Ordered    ondansetron  (ZOFRAN -ODT) 4 MG disintegrating tablet  Every 8 hours PRN        05/17/24 1159             Note:  This document was prepared using Dragon voice recognition software and may include unintentional dictation errors.   Bryson Carbine, MD 05/17/24 1200

## 2024-05-17 NOTE — ED Notes (Signed)
 Pt ambulated to bedside commode with x1 assist. Pt returned to bed without incident. NAD and connected back to monitors.

## 2024-05-17 NOTE — ED Triage Notes (Signed)
 Pt BIB [facility] from home c/o chest pain, nausea and sob that started a hour ago.  Pt denies any radiation of the chest pain and is hyperventilating upon assessment. Hx of alcohol use, pt reports drinking everyday; last drink being yesterday.   4mg  zofran  116 cbg

## 2024-12-03 ENCOUNTER — Encounter: Payer: Self-pay | Admitting: Family Medicine

## 2024-12-04 ENCOUNTER — Other Ambulatory Visit: Payer: Self-pay

## 2024-12-04 DIAGNOSIS — N6320 Unspecified lump in the left breast, unspecified quadrant: Secondary | ICD-10-CM

## 2024-12-04 DIAGNOSIS — N644 Mastodynia: Secondary | ICD-10-CM

## 2024-12-05 ENCOUNTER — Emergency Department: Payer: MEDICAID

## 2024-12-05 ENCOUNTER — Inpatient Hospital Stay
Admission: EM | Admit: 2024-12-05 | Discharge: 2024-12-07 | DRG: 312 | Disposition: A | Discharge status: Home / Self Care | Payer: MEDICAID | Attending: Osteopathic Medicine | Admitting: Osteopathic Medicine

## 2024-12-05 DIAGNOSIS — E1165 Type 2 diabetes mellitus with hyperglycemia: Secondary | ICD-10-CM | POA: Diagnosis not present

## 2024-12-05 DIAGNOSIS — F10931 Alcohol use, unspecified with withdrawal delirium: Secondary | ICD-10-CM

## 2024-12-05 DIAGNOSIS — F10939 Alcohol use, unspecified with withdrawal, unspecified: Secondary | ICD-10-CM | POA: Diagnosis present

## 2024-12-05 DIAGNOSIS — E876 Hypokalemia: Secondary | ICD-10-CM | POA: Diagnosis not present

## 2024-12-05 DIAGNOSIS — F10930 Alcohol use, unspecified with withdrawal, uncomplicated: Principal | ICD-10-CM

## 2024-12-05 DIAGNOSIS — K219 Gastro-esophageal reflux disease without esophagitis: Secondary | ICD-10-CM | POA: Diagnosis not present

## 2024-12-05 DIAGNOSIS — I10 Essential (primary) hypertension: Secondary | ICD-10-CM | POA: Diagnosis present

## 2024-12-05 LAB — CBC WITH DIFFERENTIAL/PLATELET
Abs Immature Granulocytes: 0.11 K/uL — ABNORMAL HIGH (ref 0.00–0.07)
Basophils Absolute: 0.1 K/uL (ref 0.0–0.1)
Basophils Relative: 1 %
Eosinophils Absolute: 0 K/uL (ref 0.0–0.5)
Eosinophils Relative: 0 %
HCT: 38.7 % (ref 36.0–46.0)
Hemoglobin: 14.4 g/dL (ref 12.0–15.0)
Immature Granulocytes: 1 %
Lymphocytes Relative: 10 %
Lymphs Abs: 2 K/uL (ref 0.7–4.0)
MCH: 35 pg — ABNORMAL HIGH (ref 26.0–34.0)
MCHC: 37.2 g/dL — ABNORMAL HIGH (ref 30.0–36.0)
MCV: 94.2 fL (ref 80.0–100.0)
Monocytes Absolute: 1 K/uL (ref 0.1–1.0)
Monocytes Relative: 5 %
Neutro Abs: 15.7 K/uL — ABNORMAL HIGH (ref 1.7–7.7)
Neutrophils Relative %: 83 %
Platelets: 318 K/uL (ref 150–400)
RBC: 4.11 MIL/uL (ref 3.87–5.11)
RDW: 13.2 % (ref 11.5–15.5)
WBC: 18.9 K/uL — ABNORMAL HIGH (ref 4.0–10.5)
nRBC: 0 % (ref 0.0–0.2)

## 2024-12-05 LAB — URINE DRUG SCREEN
Amphetamines: NEGATIVE
Barbiturates: POSITIVE — AB
Benzodiazepines: NEGATIVE
Cocaine: NEGATIVE
Fentanyl: NEGATIVE
Methadone Scn, Ur: NEGATIVE
Opiates: NEGATIVE
Tetrahydrocannabinol: NEGATIVE

## 2024-12-05 LAB — COMPREHENSIVE METABOLIC PANEL WITH GFR
ALT: 22 U/L (ref 0–44)
AST: 30 U/L (ref 15–41)
Albumin: 4.6 g/dL (ref 3.5–5.0)
Alkaline Phosphatase: 60 U/L (ref 38–126)
Anion gap: 36 — ABNORMAL HIGH (ref 5–15)
BUN: 20 mg/dL (ref 6–20)
CO2: 11 mmol/L — ABNORMAL LOW (ref 22–32)
Calcium: 9.8 mg/dL (ref 8.9–10.3)
Chloride: 92 mmol/L — ABNORMAL LOW (ref 98–111)
Creatinine, Ser: 1.09 mg/dL — ABNORMAL HIGH (ref 0.44–1.00)
GFR, Estimated: 60 mL/min — ABNORMAL LOW (ref >60)
Glucose, Bld: 350 mg/dL — ABNORMAL HIGH (ref 70–99)
Potassium: 3.2 mmol/L — ABNORMAL LOW (ref 3.5–5.1)
Sodium: 139 mmol/L (ref 135–145)
Total Bilirubin: 0.5 mg/dL (ref 0.0–1.2)
Total Protein: 7.9 g/dL (ref 6.5–8.1)

## 2024-12-05 LAB — TROPONIN T, HIGH SENSITIVITY
Troponin T High Sensitivity: <15 ng/L (ref 0–19)
Troponin T High Sensitivity: 16 ng/L (ref 0–19)

## 2024-12-05 LAB — D-DIMER, QUANTITATIVE: D-Dimer, Quant: 2.06 ug{FEU}/mL — ABNORMAL HIGH (ref 0.00–0.50)

## 2024-12-05 LAB — MAGNESIUM: Magnesium: 1.7 mg/dL (ref 1.7–2.4)

## 2024-12-05 LAB — PROCALCITONIN: Procalcitonin: <0.1 ng/mL

## 2024-12-05 LAB — CBG MONITORING, ED: Glucose-Capillary: 343 mg/dL — ABNORMAL HIGH (ref 70–99)

## 2024-12-05 LAB — ETHANOL: Alcohol, Ethyl (B): <15 mg/dL (ref <15)

## 2024-12-05 MED ORDER — PHENOBARBITAL SODIUM 130 MG/ML IJ SOLN
130.0000 mg | Freq: Once | INTRAMUSCULAR | Status: AC
  Administered 2024-12-05: 130 mg via INTRAVENOUS
  Filled 2024-12-05: qty 1

## 2024-12-05 MED ORDER — IOHEXOL 350 MG/ML SOLN
75.0000 mL | Freq: Once | INTRAVENOUS | Status: AC | PRN
  Administered 2024-12-05: 75 mL via INTRAVENOUS

## 2024-12-05 MED ORDER — ONDANSETRON HCL 4 MG/2ML IJ SOLN
4.0000 mg | Freq: Once | INTRAMUSCULAR | Status: AC
  Administered 2024-12-05: 4 mg via INTRAVENOUS
  Filled 2024-12-05: qty 2

## 2024-12-05 MED ORDER — LABETALOL HCL 5 MG/ML IV SOLN
20.0000 mg | INTRAVENOUS | Status: DC | PRN
  Administered 2024-12-05: 20 mg via INTRAVENOUS
  Filled 2024-12-05: qty 4

## 2024-12-05 NOTE — ED Notes (Signed)
 Per Nicholaus, MD, CIWA should be completed Q2.

## 2024-12-05 NOTE — ED Notes (Signed)
 Called [facility] to add pt to monitoring.

## 2024-12-05 NOTE — ED Notes (Signed)
 Pt ambulatory to restroom with 1x assistance due to pain in legs. Nonskid socks applied by this RN prior to ambulating.

## 2024-12-05 NOTE — ED Triage Notes (Signed)
 Pt to ED via [facility] from home due to LOC. Pt was found by EMS in her bathroom floor. Pt does not remember anything regarding LOC. Pt N/V and diaphoretic. Hx of diabetes. Hx of ETOH. Pt denies drinking today. EMS reports fruity breath smell. 4mg  zofran  and 300 ml of NS given by EMS.   CBG 354 165/91 98 O2  152 HR

## 2024-12-05 NOTE — ED Notes (Signed)
 Pt returned from CT

## 2024-12-05 NOTE — ED Notes (Signed)
 Pt transported to CT ?

## 2024-12-05 NOTE — ED Provider Notes (Signed)
 [facility] Provider Note    Event Date/Time   First MD Initiated Contact with Patient 12/05/24 1610     (approximate)   History   Loss of Consciousness   HPI  Christine Schaefer is a 53 y.o. female with alcohol dependence with alcohol withdrawal seizures, hypertension type 2 diabetes who presents to our emergency department after an episode of being found down.  Patient states that she last drink alcohol yesterday evening.  States that she was in the bathroom and the next thing she remembers was being on the floor.  She does feel as if she is in withdrawal.  Denies chest pain but endorses nausea no abdominal pain denies any SI or HI.  She is not on any blood thinning medications      Physical Exam   Triage Vital Signs: ED Triage Vitals  Encounter Vitals Group     BP 12/05/24 1600 (!) 182/108     Girls Systolic BP Percentile --      Girls Diastolic BP Percentile --      Boys Systolic BP Percentile --      Boys Diastolic BP Percentile --      Pulse Rate 12/05/24 1600 (!) 147     Resp 12/05/24 1600 20     Temp 12/05/24 1600 98.1 F (36.7 C)     Temp Source 12/05/24 1600 Oral     SpO2 12/05/24 1600 99 %     Weight 12/05/24 1557 165 lb (74.8 kg)     Height 12/05/24 1557 5' 5 (1.651 m)     Head Circumference --      Peak Flow --      Pain Score 12/05/24 1556 0     Pain Loc --      Pain Education --      Exclude from Growth Chart --     Most recent vital signs: Vitals:   12/05/24 1845 12/05/24 1951  BP: (!) 198/97 (!) 161/99  Pulse: (!) 107 (!) 108  Resp: 20   Temp:    SpO2: 95%     Nursing Triage Note reviewed. Vital signs reviewed and patients oxygen saturation is normoxic  General: Patient is well nourished, well developed, awake and alert, appears unwell, crawling out of her skin  Head: Normocephalic and atraumatic, no scalp lacerations or abrasions Eyes: Normal inspection, extraocular muscles intact, no conjunctival pallor Ear,  nose, throat: Normal external exam Neck: Normal range of motion, no C-spine tenderness to palpation Respiratory: Patient is in no respiratory distress, lungs CTAB Cardiovascular: Patient is tachycardic, RR GI: Abd SNT with no guarding or rebound  Back: Normal inspection of the back with good strength and range of motion throughout all ext Extremities: pulses intact with good cap refills, no LE pitting edema or calf tenderness Neuro: The patient is alert and oriented to person, place, and time, appropriately conversive, with 5/5 bilat UE/LE strength, no gross motor or sensory defects noted.  Very tremulous with tongue fasciculations  Psych: Anxious mood and affect, no SI or HI  ED Results / Procedures / Treatments   Labs (all labs ordered are listed, but only abnormal results are displayed) Labs Reviewed  CBC WITH DIFFERENTIAL/PLATELET - Abnormal; Notable for the following components:      Result Value   WBC 18.9 (*)    MCH 35.0 (*)    MCHC 37.2 (*)    Neutro Abs 15.7 (*)    Abs Immature Granulocytes 0.11 (*)  All other components within normal limits  COMPREHENSIVE METABOLIC PANEL WITH GFR - Abnormal; Notable for the following components:   Potassium 3.2 (*)    Chloride 92 (*)    CO2 11 (*)    Glucose, Bld 350 (*)    Creatinine, Ser 1.09 (*)    GFR, Estimated 60 (*)    Anion gap 36 (*)    All other components within normal limits  D-DIMER, QUANTITATIVE - Abnormal; Notable for the following components:   D-Dimer, Quant 2.06 (*)    All other components within normal limits  URINE DRUG SCREEN - Abnormal; Notable for the following components:   Barbiturates POSITIVE (*)    All other components within normal limits  CBG MONITORING, ED - Abnormal; Notable for the following components:   Glucose-Capillary 343 (*)    All other components within normal limits  MAGNESIUM   ETHANOL  PROCALCITONIN  TROPONIN T, HIGH SENSITIVITY  TROPONIN T, HIGH SENSITIVITY     EKG EKG and  rhythm strip are interpreted by myself:   EKG: tachcyardic rhythm] at heart rate of 163, normal QRS duration, QTc 532, nonspecific ST segments and T waves no ectopy EKG not consistent with Acute STEMI Rhythm strip: Tachycardic rhythm, unclear rate in lead II   RADIOLOGY CT head: No intracranial hemorrhage on my independent review interpretation radiologist agrees CT PE: No acute abnormality    PROCEDURES:  Critical Care performed: Yes, see critical care procedure note(s)  .Critical Care  Performed by: Nicholaus Rolland BRAVO, MD Authorized by: Nicholaus Rolland BRAVO, MD   Critical care provider statement:    Critical care time (minutes):  45   Critical care was necessary to treat or prevent imminent or life-threatening deterioration of the following conditions:  Toxidrome   Critical care was time spent personally by me on the following activities:  Development of treatment plan with patient or surrogate, discussions with consultants, evaluation of patient's response to treatment, examination of patient, ordering and review of laboratory studies, ordering and review of radiographic studies, ordering and performing treatments and interventions, pulse oximetry, re-evaluation of patient's condition and review of old charts   Care discussed with: admitting provider   Comments:     Alcohol withdrawal with repeated bolus dosing of phenobarbital  and rapid reassessments    MEDICATIONS ORDERED IN ED: Medications  labetalol  (NORMODYNE ) injection 20 mg (20 mg Intravenous Given 12/05/24 2100)  PHENObarbital  (LUMINAL) injection 130 mg (130 mg Intravenous Given 12/05/24 1618)  PHENObarbital  (LUMINAL) injection 130 mg (130 mg Intravenous Given 12/05/24 1617)  ondansetron  (ZOFRAN ) injection 4 mg (4 mg Intravenous Given 12/05/24 1626)  PHENObarbital  (LUMINAL) injection 130 mg (130 mg Intravenous Given 12/05/24 1750)  iohexol (OMNIPAQUE) 350 MG/ML injection 75 mL (75 mLs Intravenous Contrast Given 12/05/24  1932)  PHENObarbital  (LUMINAL) injection 130 mg (130 mg Intravenous Given 12/05/24 1956)     IMPRESSION / MDM / ASSESSMENT AND PLAN / ED COURSE                                Differential diagnosis includes, but is not limited to, alcohol withdrawal with seizure, intracranial hemorrhage, PE, arrhythmia, electrolyte derangement anemia   ED course: Patient arrives acutely and appears unwell.  Tachycardia anxiousness and elevated blood pressure consistent with alcohol withdrawal.  Patient received 2 IVs placed on cardiac monitor.  EKG tachycardic and with unclear rate.  However after repeated doses of phenobarbital  does appear to be sinus on  the monitor.  Patient's mentation and heart rate and blood pressure improved.  Repeat EKG ordered CT PE ordered given elevated D-dimer which was unremarkable.  Case discussed with hospitalist for admission   Clinical Course as of 12/05/24 2106  Wed Dec 05, 2024  1759 CT Head Wo Contrast No acute abnormalities [HD]  1945 Patient reassessed for the third time and I do think she still is withdrawing.  [HD]  1946 CT Angio Chest PE W and/or Wo Contrast No acute abnormality will call the patient into the hospitalist for stepdown [HD]    Clinical Course User Index [HD] Nicholaus Rolland BRAVO, MD   -- Risk: 5 This patient has a high risk of morbidity due to further diagnostic testing or treatment. Rationale: This patients evaluation and management involve a high risk of morbidity due to the potential severity of presenting symptoms, need for diagnostic testing, and/or initiation of treatment that may require close monitoring. The differential includes conditions with potential for significant deterioration or requiring escalation of care. Treatment decisions in the ED, including medication administration, procedural interventions, or disposition planning, reflect this level of risk. COPA: 5 The patient has the following acute or chronic illness/injury that poses a  possible threat to life or bodily function: [X] : The patient has a potentially serious acute condition or an acute exacerbation of a chronic illness requiring urgent evaluation and management in the Emergency Department. The clinical presentation necessitates immediate consideration of life-threatening or function-threatening diagnoses, even if they are ultimately ruled out.   FINAL CLINICAL IMPRESSION(S) / ED DIAGNOSES   Final diagnoses:  Alcohol withdrawal syndrome without complication (HCC)     Rx / DC Orders   ED Discharge Orders     None        Note:  This document was prepared using Dragon voice recognition software and may include unintentional dictation errors.   Nicholaus Rolland BRAVO, MD 12/05/24 2106

## 2024-12-05 NOTE — H&P (Addendum)
 [place]   PATIENT NAME: Christine Schaefer    MR#:  [id number]  DATE OF BIRTH:  October 06, 1971  DATE OF ADMISSION:  12/05/2024  PRIMARY CARE PHYSICIAN: Valora Lynwood FALCON, MD   Patient is coming from: Home  REQUESTING/REFERRING PHYSICIAN: Nicholaus Crazier, MD  CHIEF COMPLAINT:   Chief Complaint  Patient presents with   Loss of Consciousness    HISTORY OF PRESENT ILLNESS:  Christine Schaefer is a 53 y.o. female with medical history significant for essential hypertension, type 2 diabetes mellitus and alcohol abuse with alcohol withdrawal seizure for which she was recently admitted, who presented to the emergency room with acute onset of unresponsiveness when she was found down by her son.  This occurred in the afternoon and she is not sure how long she was down.  She admits to nausea and vomiting as well as headache without dizziness or blurred vision.  No diarrhea or melena or bright red bleeding per rectum.  No bilious vomitus or hematemesis.  Blood glucose has been elevated.  She was more alert in the emergency room when she came but was significantly anxious and agitated.  She denied any cough or wheezing or dyspnea.  No chest pain or palpitations.  No fever or chills.  No dysuria, oliguria or hematuria or flank pain.  Her last alcoholic drink was last night.  ED Course: When she came to the ER, BP was 182/108 with heart rate of 147 and respiratory rate 20.  Labs reveal hypokalemia of 3.2 with chloride 92 and CO2 of 11 with glucose of 350 and anion gap of 36.  High sensitive troponin I was less than 15 and later 16 and procalcitonin less than 0.1.  CBC showed leukocytosis of 18.9 with neutrophilia D-dimer came back 2.06.  Urine drug screen was positive for barbiturate which she was given in the ER.  Magnesium  was 1.7. EKG as reviewed by me :  EKG showed PSVT initially with a rate of 163.  Later on EKG showed sinus rhythm a rate of 82 with prolonged QT interval with QTc of 518 MS and poor  R wave progression. Imaging: Noncontrasted head CT scan revealed mild chronic small vessel ischemic disease with no acute intracranial normalities.  CTA of the chest revealed no acute intrathoracic pathology and no evidence for PE.  It showed fatty liver and aortic atherosclerosis.  The patient was given 260 mg of IV phenobarbital  followed by additional 2 doses of 130 mg IV each.  She will be admitted to AN observation stepdown unit bed for further evaluation and management. PAST MEDICAL HISTORY:   Past Medical History:  Diagnosis Date   Hypertension   -Alcohol abuse with alcohol withdrawal seizures -Type 2 diabetes mellitus PAST SURGICAL HISTORY:   Past Surgical History:  Procedure Laterality Date   EXPLORATORY LAPAROTOMY      SOCIAL HISTORY:   Social History   Tobacco Use   Smoking status: Never   Smokeless tobacco: Never  Substance Use Topics   Alcohol use: Yes    Comment: 3 or 4 cans two or three times a week.    FAMILY HISTORY:   Family History  Problem Relation Age of Onset   Breast cancer Neg Hx     DRUG ALLERGIES:  No Known Allergies  REVIEW OF SYSTEMS:   ROS As per history of present illness. All pertinent systems were reviewed above. Constitutional, HEENT, cardiovascular, respiratory, GI, GU, musculoskeletal, neuro, psychiatric, endocrine, integumentary and hematologic systems were  reviewed and are otherwise negative/unremarkable except for positive findings mentioned above in the HPI.   MEDICATIONS AT HOME:   Prior to Admission medications   Medication Sig Start Date End Date Taking? Authorizing Provider  citalopram (CELEXA) 40 MG tablet Take 40 mg by mouth daily. Patient not taking: Reported on 12/05/2024    [provider]  cyanocobalamin  1000 MCG tablet Take 1 tablet (1,000 mcg total) by mouth daily. Patient not taking: Reported on 12/05/2024 09/27/23   Dorinda Drue DASEN, MD  folic acid  (FOLVITE ) 1 MG tablet Take 1 tablet (1 mg total) by mouth  daily. Patient not taking: Reported on 12/05/2024 09/27/23   Dorinda Drue DASEN, MD  lansoprazole (PREVACID) 15 MG capsule Take by mouth. Patient not taking: Reported on 12/05/2024    [provider]  lisinopril -hydrochlorothiazide (ZESTORETIC) 20-12.5 MG tablet Take 2 tablets by mouth daily. Patient not taking: Reported on 12/05/2024    [provider]  metFORMIN (GLUCOPHAGE-XR) 500 MG 24 hr tablet Take 1,000 mg by mouth daily. Patient not taking: Reported on 12/05/2024 09/19/23   [provider]  MOUNJARO 2.5 MG/0.5ML Pen Inject 2.5 mg into the skin once a week. Patient not taking: Reported on 12/05/2024    [provider]  Multiple Vitamin (MULTIVITAMIN WITH MINERALS) TABS tablet Take 1 tablet by mouth daily. Patient not taking: Reported on 12/05/2024 09/27/23   Dorinda Drue DASEN, MD  ondansetron  (ZOFRAN -ODT) 4 MG disintegrating tablet Take 1 tablet (4 mg total) by mouth every 8 (eight) hours as needed for nausea or vomiting. Patient not taking: Reported on 12/05/2024 05/17/24   Arlander Charleston, MD  thiamine  (VITAMIN B-1) 100 MG tablet Take 1 tablet (100 mg total) by mouth daily. Patient not taking: Reported on 12/05/2024 09/27/23   Dorinda Drue DASEN, MD      VITAL SIGNS:  Blood pressure (!) 160/88, pulse 88, temperature 98.1 F (36.7 C), temperature source Oral, resp. rate (!) 21, height 5' 5 (1.651 m), weight 74.8 kg, SpO2 94%.  PHYSICAL EXAMINATION:  Physical Exam  GENERAL:  53 y.o.-year-old,Caucasian female patient lying in the bed with no acute distress.  EYES: Pupils equal, round, reactive to light and accommodation. No scleral icterus. Extraocular muscles intact.  HEENT: Head atraumatic, normocephalic. Oropharynx and nasopharynx clear.  NECK:  Supple, no jugular venous distention. No thyroid enlargement, no tenderness.  LUNGS: Normal breath sounds bilaterally, no wheezing, rales,rhonchi or crepitation. No use of accessory muscles of respiration.   CARDIOVASCULAR: Regular rate and rhythm, S1, S2 normal. No murmurs, rubs, or gallops.  ABDOMEN: Soft, nondistended, nontender. Bowel sounds present. No organomegaly or mass.  EXTREMITIES: No pedal edema, cyanosis, or clubbing.  NEUROLOGIC: Cranial nerves II through XII are intact. Muscle strength 5/5 in all extremities. Sensation intact. Gait not checked.  PSYCHIATRIC: The patient is alert and oriented x 3.  Normal affect and good eye contact. SKIN: No obvious rash, lesion, or ulcer.   LABORATORY PANEL:   CBC Recent Labs  Lab 12/05/24 1603  WBC 18.9*  HGB 14.4  HCT 38.7  PLT 318   ------------------------------------------------------------------------------------------------------------------  Chemistries  Recent Labs  Lab 12/05/24 1603  NA 139  K 3.2*  CL 92*  CO2 11*  GLUCOSE 350*  BUN 20  CREATININE 1.09*  CALCIUM  9.8  MG 1.7  AST 30  ALT 22  ALKPHOS 60  BILITOT 0.5   ------------------------------------------------------------------------------------------------------------------  Cardiac Enzymes No results for input(s): TROPONINI in the last 168 hours. ------------------------------------------------------------------------------------------------------------------  RADIOLOGY:  CT Angio Chest PE W  and/or Wo Contrast Result Date: 12/05/2024 CLINICAL DATA:  Elevated D-dimer.  Syncope. EXAM: CT ANGIOGRAPHY CHEST WITH CONTRAST TECHNIQUE: Multidetector CT imaging of the chest was performed using the standard protocol during bolus administration of intravenous contrast. Multiplanar CT image reconstructions and MIPs were obtained to evaluate the vascular anatomy. RADIATION DOSE REDUCTION: This exam was performed according to the departmental dose-optimization program which includes automated exposure control, adjustment of the mA and/or kV according to patient size and/or use of iterative reconstruction technique. CONTRAST:  75mL OMNIPAQUE IOHEXOL 350 MG/ML SOLN  COMPARISON:  Chest CT dated 02/14/2005 and radiograph dated 05/17/2024. FINDINGS: Cardiovascular: There is no cardiomegaly or pericardial effusion. Three-vessel coronary vascular calcification. Mild atherosclerotic calcification of the thoracic aorta. No aneurysmal dilatation or dissection. No pulmonary artery embolus identified. Mediastinum/Nodes: No hilar or mediastinal adenopathy. The esophagus is grossly unremarkable. No mediastinal fluid collection. Lungs/Pleura: No focal consolidation, pleural effusion, pneumothorax. The central airways are patent. Upper Abdomen: Fatty liver. Musculoskeletal: No chest wall abnormality. No acute or significant osseous findings. Review of the MIP images confirms the above findings. IMPRESSION: 1. No acute intrathoracic pathology. No CT evidence of pulmonary artery embolus. 2. Fatty liver. 3.  Aortic Atherosclerosis (ICD10-I70.0). Electronically Signed   By: Vanetta Chou M.D.   On: 12/05/2024 19:43   CT Head Wo Contrast Result Date: 12/05/2024 EXAM: CT HEAD WITHOUT 12/05/2024 04:58:33 PM TECHNIQUE: CT of the head was performed without the administration of intravenous contrast. Automated exposure control, iterative reconstruction, and/or weight based adjustment of the mA/kV was utilized to reduce the radiation dose to as low as reasonably achievable. COMPARISON: Head CT 12/12/2023 and MRI 12/13/2023. CLINICAL HISTORY: Fall, syncope. FINDINGS: BRAIN AND VENTRICLES: There is no evidence of an acute infarct, intracranial hemorrhage, mass, midline shift, hydrocephalus, or extra-axial fluid collection. Cerebral volume is within normal limits for age. Patchy hypodensities in the cerebral white matter bilaterally are similar to the prior CT and nonspecific but compatible with mild chronic small vessel ischemic disease. Calcified atherosclerosis at the skull base. ORBITS: No acute abnormality. SINUSES AND MASTOIDS: No acute abnormality. SOFT TISSUES AND SKULL: No acute skull  fracture. No acute soft tissue abnormality. IMPRESSION: 1. No acute intracranial abnormality. 2. Mild chronic small vessel ischemic disease. Electronically signed by: Dasie Hamburg MD 12/05/2024 05:27 PM EST RP Workstation: [id number]      IMPRESSION AND PLAN:  Assessment and Plan: * Alcohol withdrawal (HCC) - She will be admitted to an observation stepdown unit bed. - Will continue her on CIWA protocol with as needed IV Ativan  as well as add as needed IV phenobarbital . - She was counseled for alcohol cessation. - Will be monitored for seizures given her previous history of alcohol withdrawal seizures.  Hypokalemia - Potassium will be replaced and magnesium  level will be optimized.  Uncontrolled type 2 diabetes mellitus with hyperglycemia, without long-term current use of insulin  (HCC) - The patient will be placed on supplemental coverage with NovoLog . - Will be hydrated with IV normal saline. - Will hold off on Glucophage for now.  GERD without esophagitis - Will continue PPI therapy.  Depression - Will continue Celexa.  Essential hypertension - Will continue antihypertensive therapy.   DVT prophylaxis: Lovenox .  Advanced Care Planning:  Code Status: full code.  Family Communication:  The plan of care was discussed in details with the patient (and family). I answered all questions. The patient agreed to proceed with the above mentioned plan. Further management will depend upon hospital course. Disposition Plan: Back to previous home  environment Consults called: none.  All the records are reviewed and case discussed with ED provider.  Status is: Observation  I certify that at the time of admission, it is my clinical judgment that the patient will require hospital care extending less than 2 midnights.                            Dispo: The patient is from: Home              Anticipated d/c is to: Home              Patient currently is not medically stable to d/c.               Difficult to place patient: No  Madison DELENA Peaches M.D on 12/06/2024 at 12:33 AM  [facility]   From 7 PM-7 AM, contact night-coverage [email or web address]  CC: Primary care physician; Valora Lynwood FALCON, MD

## 2024-12-06 ENCOUNTER — Other Ambulatory Visit: Payer: Self-pay

## 2024-12-06 DIAGNOSIS — Z79899 Other long term (current) drug therapy: Secondary | ICD-10-CM | POA: Diagnosis not present

## 2024-12-06 DIAGNOSIS — E663 Overweight: Secondary | ICD-10-CM | POA: Diagnosis present

## 2024-12-06 DIAGNOSIS — K219 Gastro-esophageal reflux disease without esophagitis: Secondary | ICD-10-CM

## 2024-12-06 DIAGNOSIS — I4891 Unspecified atrial fibrillation: Secondary | ICD-10-CM | POA: Diagnosis present

## 2024-12-06 DIAGNOSIS — I471 Supraventricular tachycardia, unspecified: Secondary | ICD-10-CM | POA: Diagnosis present

## 2024-12-06 DIAGNOSIS — E1165 Type 2 diabetes mellitus with hyperglycemia: Secondary | ICD-10-CM | POA: Diagnosis present

## 2024-12-06 DIAGNOSIS — E876 Hypokalemia: Secondary | ICD-10-CM | POA: Diagnosis present

## 2024-12-06 DIAGNOSIS — F32A Depression, unspecified: Secondary | ICD-10-CM | POA: Diagnosis present

## 2024-12-06 DIAGNOSIS — I10 Essential (primary) hypertension: Secondary | ICD-10-CM | POA: Diagnosis present

## 2024-12-06 DIAGNOSIS — D72829 Elevated white blood cell count, unspecified: Secondary | ICD-10-CM | POA: Diagnosis present

## 2024-12-06 DIAGNOSIS — Z6827 Body mass index (BMI) 27.0-27.9, adult: Secondary | ICD-10-CM | POA: Diagnosis not present

## 2024-12-06 DIAGNOSIS — R55 Syncope and collapse: Secondary | ICD-10-CM | POA: Diagnosis present

## 2024-12-06 DIAGNOSIS — S060XAA Concussion with loss of consciousness status unknown, initial encounter: Secondary | ICD-10-CM | POA: Diagnosis present

## 2024-12-06 DIAGNOSIS — F1010 Alcohol abuse, uncomplicated: Secondary | ICD-10-CM | POA: Diagnosis present

## 2024-12-06 LAB — BASIC METABOLIC PANEL WITH GFR
Anion gap: 14 (ref 5–15)
BUN: 22 mg/dL — ABNORMAL HIGH (ref 6–20)
CO2: 27 mmol/L (ref 22–32)
Calcium: 9.2 mg/dL (ref 8.9–10.3)
Chloride: 97 mmol/L — ABNORMAL LOW (ref 98–111)
Creatinine, Ser: 1.13 mg/dL — ABNORMAL HIGH (ref 0.44–1.00)
GFR, Estimated: 57 mL/min — ABNORMAL LOW (ref >60)
Glucose, Bld: 212 mg/dL — ABNORMAL HIGH (ref 70–99)
Potassium: 3.4 mmol/L — ABNORMAL LOW (ref 3.5–5.1)
Sodium: 138 mmol/L (ref 135–145)

## 2024-12-06 LAB — HIV ANTIBODY (ROUTINE TESTING W REFLEX): HIV Screen 4th Generation wRfx: NONREACTIVE

## 2024-12-06 LAB — CBC
HCT: 33.6 % — ABNORMAL LOW (ref 36.0–46.0)
Hemoglobin: 12 g/dL (ref 12.0–15.0)
MCH: 33.6 pg (ref 26.0–34.0)
MCHC: 35.7 g/dL (ref 30.0–36.0)
MCV: 94.1 fL (ref 80.0–100.0)
Platelets: 239 K/uL (ref 150–400)
RBC: 3.57 MIL/uL — ABNORMAL LOW (ref 3.87–5.11)
RDW: 13.2 % (ref 11.5–15.5)
WBC: 8.9 K/uL (ref 4.0–10.5)
nRBC: 0 % (ref 0.0–0.2)

## 2024-12-06 MED ORDER — MAGNESIUM HYDROXIDE 400 MG/5ML PO SUSP: 30.0000 mL | Freq: Every day | ORAL | Status: DC | PRN

## 2024-12-06 MED ORDER — ONDANSETRON HCL 4 MG/2ML IJ SOLN: 4.0000 mg | Freq: Four times a day (QID) | INTRAMUSCULAR | Status: DC | PRN

## 2024-12-06 MED ORDER — CITALOPRAM HYDROBROMIDE 20 MG PO TABS: 40.0000 mg | ORAL_TABLET | Freq: Every day | ORAL | Status: DC

## 2024-12-06 MED ORDER — LORAZEPAM 2 MG/ML IJ SOLN: 1.0000 mg | INTRAMUSCULAR | Status: DC | PRN

## 2024-12-06 MED ORDER — LISINOPRIL 20 MG PO TABS
40.0000 mg | ORAL_TABLET | Freq: Every day | ORAL | Status: DC
  Administered 2024-12-06 – 2024-12-07 (×2): 40 mg via ORAL
  Filled 2024-12-06: qty 2
  Filled 2024-12-06: qty 4

## 2024-12-06 MED ORDER — ENOXAPARIN SODIUM 40 MG/0.4ML IJ SOSY
40.0000 mg | PREFILLED_SYRINGE | INTRAMUSCULAR | Status: DC
  Administered 2024-12-06 – 2024-12-07 (×2): 40 mg via SUBCUTANEOUS
  Filled 2024-12-06 (×2): qty 0.4

## 2024-12-06 MED ORDER — SODIUM CHLORIDE 0.9 % IV SOLN: INTRAVENOUS | Status: DC

## 2024-12-06 MED ORDER — HYDROCHLOROTHIAZIDE 25 MG PO TABS
25.0000 mg | ORAL_TABLET | Freq: Every day | ORAL | Status: DC
  Administered 2024-12-06 – 2024-12-07 (×2): 25 mg via ORAL
  Filled 2024-12-06 (×2): qty 1

## 2024-12-06 MED ORDER — ACETAMINOPHEN 650 MG RE SUPP: 650.0000 mg | Freq: Four times a day (QID) | RECTAL | Status: DC | PRN

## 2024-12-06 MED ORDER — ACETAMINOPHEN 325 MG PO TABS
650.0000 mg | ORAL_TABLET | Freq: Four times a day (QID) | ORAL | Status: DC | PRN
  Administered 2024-12-06: 650 mg via ORAL
  Filled 2024-12-06: qty 2

## 2024-12-06 MED ORDER — LORAZEPAM 1 MG PO TABS: 1.0000 mg | ORAL_TABLET | ORAL | Status: DC | PRN

## 2024-12-06 MED ORDER — FOLIC ACID 1 MG PO TABS
1.0000 mg | ORAL_TABLET | Freq: Every day | ORAL | Status: DC
  Administered 2024-12-06 – 2024-12-07 (×2): 1 mg via ORAL
  Filled 2024-12-06 (×2): qty 1

## 2024-12-06 MED ORDER — POTASSIUM CHLORIDE CRYS ER 20 MEQ PO TBCR: 40.0000 meq | EXTENDED_RELEASE_TABLET | Freq: Once | ORAL | Status: DC

## 2024-12-06 MED ORDER — ONDANSETRON HCL 4 MG PO TABS: 4.0000 mg | ORAL_TABLET | Freq: Four times a day (QID) | ORAL | Status: DC | PRN

## 2024-12-06 MED ORDER — VITAMIN B-12 1000 MCG PO TABS
1000.0000 ug | ORAL_TABLET | Freq: Every day | ORAL | Status: DC
  Administered 2024-12-06 – 2024-12-07 (×2): 1000 ug via ORAL
  Filled 2024-12-06: qty 1
  Filled 2024-12-06: qty 2

## 2024-12-06 MED ORDER — TRAZODONE HCL 50 MG PO TABS: 25.0000 mg | ORAL_TABLET | Freq: Every evening | ORAL | Status: DC | PRN

## 2024-12-06 MED ORDER — THIAMINE MONONITRATE 100 MG PO TABS
100.0000 mg | ORAL_TABLET | Freq: Every day | ORAL | Status: DC
  Administered 2024-12-06 – 2024-12-07 (×2): 100 mg via ORAL
  Filled 2024-12-06 (×2): qty 1

## 2024-12-06 MED ORDER — LISINOPRIL-HYDROCHLOROTHIAZIDE 20-12.5 MG PO TABS: 2.0000 | ORAL_TABLET | Freq: Every day | ORAL | Status: DC

## 2024-12-06 MED ORDER — PHENOBARBITAL SODIUM 130 MG/ML IJ SOLN: 130.0000 mg | Freq: Three times a day (TID) | INTRAMUSCULAR | Status: DC | PRN

## 2024-12-06 MED ORDER — ADULT MULTIVITAMIN W/MINERALS CH
1.0000 | ORAL_TABLET | Freq: Every day | ORAL | Status: DC
  Administered 2024-12-06 – 2024-12-07 (×2): 1 via ORAL
  Filled 2024-12-06 (×2): qty 1

## 2024-12-06 MED ORDER — POTASSIUM CHLORIDE 20 MEQ PO PACK
40.0000 meq | PACK | Freq: Once | ORAL | Status: AC
  Administered 2024-12-06: 40 meq via ORAL
  Filled 2024-12-06: qty 2

## 2024-12-06 MED ORDER — PANTOPRAZOLE SODIUM 20 MG PO TBEC: 20.0000 mg | DELAYED_RELEASE_TABLET | Freq: Every day | ORAL | Status: DC

## 2024-12-06 MED ORDER — MAGNESIUM SULFATE 2 GM/50ML IV SOLN
2.0000 g | Freq: Once | INTRAVENOUS | Status: AC
  Administered 2024-12-06: 2 g via INTRAVENOUS
  Filled 2024-12-06: qty 50

## 2024-12-06 NOTE — Progress Notes (Signed)
 PROGRESS NOTE    Christine Schaefer [facility]   FMW:[id number] DOB: 04-07-1971  DOA: 12/05/2024 Date of Service: 12/06/2024 which is hospital day 0  PCP: Valora Lynwood FALCON, MD    Hospital course / significant events:   Christine Schaefer is a 53 y.o. female with medical history significant for essential hypertension, type 2 diabetes mellitus and alcohol abuse with alcohol withdrawal seizure for which she was recently admitted,   presented to the emergency room with acute onset of unresponsiveness when she was found down by her son.   HPI: unresponsiveness when she was found down by her son. This occurred in the afternoon 12/10 and she is not sure how long she was down. She admits to nausea and vomiting as well as headache without dizziness or blurred vision. Per H&P, she was more alert in the emergency room when she came but was significantly anxious and agitated. Her last alcoholic drink was last night 87/90.   12/10: to ED. Hypertensive, tachycardic, hyperglycemix, WBC 18, UDS no illicits, (+)barbirutate which was given in ED (phenobarb). EKG showed PSVT initially with a rate of 163.  Later on EKG showed sinus rhythm a rate of 82 with prolonged QT interval with QTc of 518 MS and poor R wave progression. Admitted to hospitalist w/ EtOH withdrawal. CTA chest and CT head ok.  12/11: WBC WNL at 8.9, VSS, syncope w/u pending echo, cardio can place Zio monitor probably tomorrow prior to dc      Consultants:  none  Procedures/Surgeries: none      ASSESSMENT & PLAN:   Syncope Question related to EtOH Question PAfib Echo pending  Zio on discharge  Atrial fibrillation, now NSR Afib on initial EKG Telemetry Zio on discharge [facility] for now hold [facility] given not sustained, pending cardiac monitor   Alcohol withdrawal risk ciwa   Hypokalemia Replace as needed Monitor BMP    Uncontrolled type 2 diabetes mellitus with hyperglycemia, without long-term current use of insulin  (HCC) SSI    GERD without esophagitis - Will continue PPI therapy.   Depression - Will continue Celexa.   Essential hypertension - Will continue antihypertensive therapy.    overweight based on BMI: Body mass index is 27.46 kg/m.SABRA Significantly low or high BMI is associated with higher medical risk.  Underweight - under 18  overweight - 25 to 29 obese - 30 or more Class 1 obesity: BMI of 30.0 to 34 Class 2 obesity: BMI of 35.0 to 39 Class 3 obesity: BMI of 40.0 to 49 Super Morbid Obesity: BMI 50-59 Super-super Morbid Obesity: BMI 60+ Healthy nutrition and physical activity advised as adjunct to other disease management and risk reduction treatments    DVT prophylaxis: lovenox  IV fluids: no continuous IV fluids  Nutrition: carb Central lines / other devices: none  Code Status: FULL CODE ACP documentation reviewed:  none on file in [facility]  TOC needs: TBD Medical barriers to dispo: echo. Expected medical readiness for discharge tomorrow.              Subjective / Brief ROS:  Patient reports feeling unsteady today Does not remember falling Denies CP/SOB.  Pain controlled.  Denies new weakness.  Tolerating diet.  Reports no concerns w/ urination/defecation.   Family Communication: none at this time     Objective Findings:  Vitals:   12/06/24 0906 12/06/24 1100 12/06/24 1130 12/06/24 1600  BP: (!) 156/86 138/72 137/78 139/84  Pulse:  83 79 70  Resp:  19 17 (!) 22  Temp:  98 F (36.7 C) 97.9 F (36.6 C)  TempSrc:   Oral Oral  SpO2:  97% 97% 98%  Weight:      Height:        Intake/Output Summary (Last 24 hours) at 12/06/2024 1844 Last data filed at 12/06/2024 9089 Gross per 24 hour  Intake 877.53 ml  Output --  Net 877.53 ml   Filed Weights   12/05/24 1557  Weight: 74.8 kg    Examination:  Physical Exam Constitutional:      General: She is not in acute distress.    Appearance: She is not ill-appearing.  Cardiovascular:     Rate and Rhythm:  Normal rate and regular rhythm.  Pulmonary:     Effort: Pulmonary effort is normal.     Breath sounds: Normal breath sounds.  Abdominal:     General: Bowel sounds are normal.     Palpations: Abdomen is soft.  Musculoskeletal:     Right lower leg: No edema.     Left lower leg: No edema.  Neurological:     General: No focal deficit present.     Mental Status: She is alert and oriented to person, place, and time.  Psychiatric:        Mood and Affect: Mood normal.        Behavior: Behavior normal.          Scheduled Medications:   cyanocobalamin   1,000 mcg Oral Daily   enoxaparin  (LOVENOX ) injection  40 mg Subcutaneous Q24H   folic acid   1 mg Oral Daily   hydrochlorothiazide  25 mg Oral Daily   lisinopril   40 mg Oral Daily   multivitamin with minerals  1 tablet Oral Daily   thiamine   100 mg Oral Daily    Continuous Infusions:  sodium chloride  100 mL/hr at 12/06/24 1048    PRN Medications:  acetaminophen  **OR** acetaminophen , labetalol , LORazepam  **OR** LORazepam , magnesium  hydroxide, ondansetron  **OR** ondansetron  (ZOFRAN ) IV, PHENObarbital , traZODone  Antimicrobials from admission:  Anti-infectives (From admission, onward)    None           Data Reviewed:  I have personally reviewed the following...  CBC: Recent Labs  Lab 12/05/24 1603 12/06/24 0615  WBC 18.9* 8.9  NEUTROABS 15.7*  --   HGB 14.4 12.0  HCT 38.7 33.6*  MCV 94.2 94.1  PLT 318 239   Basic Metabolic Panel: Recent Labs  Lab 12/05/24 1603 12/06/24 0550  NA 139 138  K 3.2* 3.4*  CL 92* 97*  CO2 11* 27  GLUCOSE 350* 212*  BUN 20 22*  CREATININE 1.09* 1.13*  CALCIUM  9.8 9.2  MG 1.7  --    GFR: Estimated Creatinine Clearance: 56.9 mL/min (A) (by C-G formula based on SCr of 1.13 mg/dL (H)). Liver Function Tests: Recent Labs  Lab 12/05/24 1603  AST 30  ALT 22  ALKPHOS 60  BILITOT 0.5  PROT 7.9  ALBUMIN 4.6   No results for input(s): LIPASE, AMYLASE in the last 168  hours. No results for input(s): AMMONIA in the last 168 hours. Coagulation Profile: No results for input(s): INR, PROTIME in the last 168 hours. Cardiac Enzymes: No results for input(s): CKTOTAL, CKMB, CKMBINDEX, TROPONINI in the last 168 hours. BNP (last 3 results) No results for input(s): PROBNP in the last 8760 hours. HbA1C: No results for input(s): HGBA1C in the last 72 hours. CBG: Recent Labs  Lab 12/05/24 1601  GLUCAP 343*   Lipid Profile: No results for input(s): CHOL, HDL, LDLCALC, TRIG,  CHOLHDL, LDLDIRECT in the last 72 hours. Thyroid Function Tests: No results for input(s): TSH, T4TOTAL, FREET4, T3FREE, THYROIDAB in the last 72 hours. Anemia Panel: No results for input(s): VITAMINB12, FOLATE, FERRITIN, TIBC, IRON, RETICCTPCT in the last 72 hours. Most Recent Urinalysis On File:     Component Value Date/Time   COLORURINE YELLOW (A) 09/24/2023 1258   APPEARANCEUR HAZY (A) 09/24/2023 1258   LABSPEC 1.008 09/24/2023 1258   PHURINE 6.0 09/24/2023 1258   GLUCOSEU >=500 (A) 09/24/2023 1258   HGBUR MODERATE (A) 09/24/2023 1258   BILIRUBINUR NEGATIVE 09/24/2023 1258   KETONESUR 20 (A) 09/24/2023 1258   PROTEINUR 30 (A) 09/24/2023 1258   NITRITE NEGATIVE 09/24/2023 1258   LEUKOCYTESUR NEGATIVE 09/24/2023 1258   Sepsis Labs: @LABRCNTIP (procalcitonin:4,lacticidven:4) Microbiology: No results found for this or any previous visit (from the past 240 hours).    Radiology Studies last 3 days: CT Angio Chest PE W and/or Wo Contrast Result Date: 12/05/2024 CLINICAL DATA:  Elevated D-dimer.  Syncope. EXAM: CT ANGIOGRAPHY CHEST WITH CONTRAST TECHNIQUE: Multidetector CT imaging of the chest was performed using the standard protocol during bolus administration of intravenous contrast. Multiplanar CT image reconstructions and MIPs were obtained to evaluate the vascular anatomy. RADIATION DOSE REDUCTION: This exam was performed  according to the departmental dose-optimization program which includes automated exposure control, adjustment of the mA and/or kV according to patient size and/or use of iterative reconstruction technique. CONTRAST:  75mL OMNIPAQUE IOHEXOL 350 MG/ML SOLN COMPARISON:  Chest CT dated 02/14/2005 and radiograph dated 05/17/2024. FINDINGS: Cardiovascular: There is no cardiomegaly or pericardial effusion. Three-vessel coronary vascular calcification. Mild atherosclerotic calcification of the thoracic aorta. No aneurysmal dilatation or dissection. No pulmonary artery embolus identified. Mediastinum/Nodes: No hilar or mediastinal adenopathy. The esophagus is grossly unremarkable. No mediastinal fluid collection. Lungs/Pleura: No focal consolidation, pleural effusion, pneumothorax. The central airways are patent. Upper Abdomen: Fatty liver. Musculoskeletal: No chest wall abnormality. No acute or significant osseous findings. Review of the MIP images confirms the above findings. IMPRESSION: 1. No acute intrathoracic pathology. No CT evidence of pulmonary artery embolus. 2. Fatty liver. 3.  Aortic Atherosclerosis (ICD10-I70.0). Electronically Signed   By: Vanetta Chou M.D.   On: 12/05/2024 19:43   CT Head Wo Contrast Result Date: 12/05/2024 EXAM: CT HEAD WITHOUT 12/05/2024 04:58:33 PM TECHNIQUE: CT of the head was performed without the administration of intravenous contrast. Automated exposure control, iterative reconstruction, and/or weight based adjustment of the mA/kV was utilized to reduce the radiation dose to as low as reasonably achievable. COMPARISON: Head CT 12/12/2023 and MRI 12/13/2023. CLINICAL HISTORY: Fall, syncope. FINDINGS: BRAIN AND VENTRICLES: There is no evidence of an acute infarct, intracranial hemorrhage, mass, midline shift, hydrocephalus, or extra-axial fluid collection. Cerebral volume is within normal limits for age. Patchy hypodensities in the cerebral white matter bilaterally are similar to  the prior CT and nonspecific but compatible with mild chronic small vessel ischemic disease. Calcified atherosclerosis at the skull base. ORBITS: No acute abnormality. SINUSES AND MASTOIDS: No acute abnormality. SOFT TISSUES AND SKULL: No acute skull fracture. No acute soft tissue abnormality. IMPRESSION: 1. No acute intracranial abnormality. 2. Mild chronic small vessel ischemic disease. Electronically signed by: Dasie Hamburg MD 12/05/2024 05:27 PM EST RP Workstation: [id number]          Laneta Blunt, DO [facility] 12/06/2024, 6:44 PM    Dictation software may have been used to generate the above note. Typos may occur and escape review in typed/dictated notes. Please contact Dr Blunt directly for  clarity if needed.  Staff may message me via secure chat in Epic  but this may not receive an immediate response,  please page me for urgent matters!  If 7PM-7AM, please contact night coverage [email or web address]

## 2024-12-06 NOTE — Assessment & Plan Note (Signed)
Will continue Celexa

## 2024-12-06 NOTE — Hospital Course (Addendum)
 Hospital course / significant events:   Christine Schaefer is a 53 y.o. female with medical history significant for essential hypertension, type 2 diabetes mellitus and alcohol abuse with alcohol withdrawal seizure for which she was recently admitted,   presented to the emergency room with acute onset of unresponsiveness when she was found down by her son.   HPI: unresponsiveness when she was found down by her son. This occurred in the afternoon 12/10 and she is not sure how long she was down. She admits to nausea and vomiting as well as headache without dizziness or blurred vision. Per H&P, she was more alert in the emergency room when she came but was significantly anxious and agitated. Her last alcoholic drink was last night 87/90.   12/10: to ED. Hypertensive, tachycardic, hyperglycemix, WBC 18, UDS no illicits, (+)barbirutate which was given in ED (phenobarb). EKG showed PSVT initially with a rate of 163.  Later on EKG showed sinus rhythm a rate of 82 with prolonged QT interval with QTc of 518 MS and poor R wave progression. Admitted to hospitalist w/ EtOH withdrawal. CTA chest and CT head ok.  12/11: WBC WNL at 8.9, VSS, syncope w/u pending echo, cardio can place Zio monitor probably tomorrow prior to dc      Consultants:  none  Procedures/Surgeries: none      ASSESSMENT & PLAN:   Syncope Question related to EtOH Question PAfib Echo pending  Zio on discharge  Fall and head trauma with concussion  Concussion care discussed and will give printed instructions on discharge  NO alcohol   Atrial fibrillation, now NSR Afib on initial EKG Telemetry Zio on discharge [facility] for now hold [facility] given not sustained, pending cardiac monitor   Alcohol withdrawal risk ciwa   Hypokalemia Replace as needed Monitor BMP    Uncontrolled type 2 diabetes mellitus with hyperglycemia, without long-term current use of insulin  (HCC) Resume metformin    GERD without esophagitis PPI    Depression Pt not taking home Celexa - follow w/ PCP/BH   Essential hypertension Lisinopril -HCT    overweight based on BMI: Body mass index is 27.46 kg/m.SABRA Significantly low or high BMI is associated with higher medical risk.  Underweight - under 18  overweight - 25 to 29 obese - 30 or more Class 1 obesity: BMI of 30.0 to 34 Class 2 obesity: BMI of 35.0 to 39 Class 3 obesity: BMI of 40.0 to 49 Super Morbid Obesity: BMI 50-59 Super-super Morbid Obesity: BMI 60+ Healthy nutrition and physical activity advised as adjunct to other disease management and risk reduction treatments    DVT prophylaxis: lovenox  IV fluids: no continuous IV fluids  Nutrition: carb Central lines / other devices: none  Code Status: FULL CODE ACP documentation reviewed:  none on file in [facility]  TOC needs: TBD Medical barriers to dispo: echo. Expected medical readiness for discharge tomorrow.

## 2024-12-06 NOTE — Progress Notes (Signed)
° ° °  PROCEDURAL EXPEDITER PROGRESS NOTE  Patient Name: Christine Schaefer  DOB:24-Jul-1971 Date of Admission: 12/05/2024  Date of Assessment:12/06/2024   -------------------------------------------------------------------------------------------------------------------  Reached out to the attending Dr. Laneta Blunt, to see if we could downgrade the pt to Tele from Progressive.  Pt been 12+ hrs, VSS, A&Ox4.  No seizure activity since at home yesterday afternoon.  -------------------------------------------------------------------------------------------------------------------  [facility], [name] Please contact us  directly via secure chat (search for [facility]) or by calling us  at [phone] [facility]).

## 2024-12-06 NOTE — Inpatient Diabetes Management (Signed)
 Inpatient Diabetes Program Recommendations  AACE/ADA: New Consensus Statement on Inpatient Glycemic Control (2015)  Target Ranges:  Prepandial:   less than 140 mg/dL      Peak postprandial:   less than 180 mg/dL (1-2 hours)      Critically ill patients:  140 - 180 mg/dL   Lab Results  Component Value Date   GLUCAP 343 (H) 12/05/2024   HGBA1C 6.4 (H) 09/24/2023    Review of Glycemic Control  Latest Reference Range & Units 12/05/24 16:01  Glucose-Capillary 70 - 99 mg/dL 656 (H)  (H): Data is abnormally high  Diabetes history: DM2 Outpatient Diabetes medications:  Metformin 1 gram every day Mounjaro 2.5 mg weekly Current orders for Inpatient glycemic control:  none  Inpatient Diabetes Program Recommendations:    Please consider ordering the Glycemic Control Order Set using:  Novolog  0-9 units TID and 0-5 units at bedtime.  Thank you, Wyvonna Pinal, MSN, CDCES Diabetes Coordinator Inpatient Diabetes Program [phone] (team pager from 8a-5p)

## 2024-12-06 NOTE — Assessment & Plan Note (Signed)
-   Will continue PPI therapy.

## 2024-12-06 NOTE — Assessment & Plan Note (Signed)
 Potassium will be replaced and magnesium  level will be optimized

## 2024-12-06 NOTE — Assessment & Plan Note (Addendum)
-   She will be admitted to an observation stepdown unit bed. - Will continue her on CIWA protocol with as needed IV Ativan  as well as add as needed IV phenobarbital . - She was counseled for alcohol cessation. - Will be monitored for seizures given her previous history of alcohol withdrawal seizures.

## 2024-12-06 NOTE — Assessment & Plan Note (Signed)
-   The patient will be placed on supplemental coverage with NovoLog . - Will be hydrated with IV normal saline. - Will hold off on Glucophage for now.

## 2024-12-06 NOTE — TOC Progression Note (Signed)
 Transition of Care [facility]) - Progression Note    Patient Details  Name: Christine Schaefer MRN: [id number] Date of Birth: 12-22-1971  Transition of Care [facility]) CM/SW Contact  Mikaelah Trostle L Rashawd [place], [place] Phone Number: 12/06/2024, 11:10 AM  Clinical Narrative:     [facility] consult received for substance abuse education/counseling. TOC does not provide education/counseling. Substance Abuse resources added to the AVS.                     Expected Discharge Plan and Services                                               Social Drivers of Health (SDOH) Interventions SDOH Screenings   Food Insecurity: No Food Insecurity (12/13/2023)  Recent Concern: Food Insecurity - Food Insecurity Present (09/29/2023)   Received from [facility]  Housing: Low Risk (12/13/2023)  Transportation Needs: No Transportation Needs (12/13/2023)  Utilities: Not At Risk (12/13/2023)  Financial Resource Strain: Medium Risk (09/29/2023)   Received from [facility]  Tobacco Use: Low Risk (12/05/2024)  Recent Concern: Tobacco Use - Medium Risk (12/04/2024)   Received from [facility]    Readmission Risk Interventions     No data to display

## 2024-12-06 NOTE — Assessment & Plan Note (Signed)
-   Will continue antihypertensive therapy.

## 2024-12-06 NOTE — Discharge Instructions (Signed)

## 2024-12-07 ENCOUNTER — Inpatient Hospital Stay: Admit: 2024-12-07 | Payer: MEDICAID

## 2024-12-07 ENCOUNTER — Inpatient Hospital Stay: Admit: 2024-12-07 | Discharge: 2024-12-07 | Disposition: A | Payer: MEDICAID | Attending: Osteopathic Medicine

## 2024-12-07 LAB — ECHOCARDIOGRAM COMPLETE
AR max vel: 2.39 cm2
AV Area VTI: 2.59 cm2
AV Area mean vel: 2.51 cm2
AV Mean grad: 4 mmHg
AV Peak grad: 8.1 mmHg
Ao pk vel: 1.42 m/s
Area-P 1/2: 2.55 cm2
Height: 65 in
MV VTI: 2.61 cm2
S' Lateral: 2.8 cm
Weight: 2640 [oz_av]

## 2024-12-07 LAB — COMPREHENSIVE METABOLIC PANEL WITH GFR
ALT: 18 U/L (ref 0–44)
AST: 27 U/L (ref 15–41)
Albumin: 3.9 g/dL (ref 3.5–5.0)
Alkaline Phosphatase: 48 U/L (ref 38–126)
Anion gap: 14 (ref 5–15)
BUN: 16 mg/dL (ref 6–20)
CO2: 24 mmol/L (ref 22–32)
Calcium: 8.8 mg/dL — ABNORMAL LOW (ref 8.9–10.3)
Chloride: 98 mmol/L (ref 98–111)
Creatinine, Ser: 0.8 mg/dL (ref 0.44–1.00)
GFR, Estimated: >60 mL/min (ref >60)
Glucose, Bld: 164 mg/dL — ABNORMAL HIGH (ref 70–99)
Potassium: 3.4 mmol/L — ABNORMAL LOW (ref 3.5–5.1)
Sodium: 136 mmol/L (ref 135–145)
Total Bilirubin: 0.5 mg/dL (ref 0.0–1.2)
Total Protein: 6.5 g/dL (ref 6.5–8.1)

## 2024-12-07 LAB — CBC
HCT: 34.8 % — ABNORMAL LOW (ref 36.0–46.0)
Hemoglobin: 12 g/dL (ref 12.0–15.0)
MCH: 33.3 pg (ref 26.0–34.0)
MCHC: 34.5 g/dL (ref 30.0–36.0)
MCV: 96.7 fL (ref 80.0–100.0)
Platelets: 181 K/uL (ref 150–400)
RBC: 3.6 MIL/uL — ABNORMAL LOW (ref 3.87–5.11)
RDW: 13 % (ref 11.5–15.5)
WBC: 6.2 K/uL (ref 4.0–10.5)
nRBC: 0 % (ref 0.0–0.2)

## 2024-12-07 LAB — HEMOGLOBIN A1C
Hgb A1c MFr Bld: 7.2 % — ABNORMAL HIGH (ref 4.8–5.6)
Mean Plasma Glucose: 159.94 mg/dL

## 2024-12-07 LAB — TSH: TSH: 1.52 u[IU]/mL (ref 0.350–4.500)

## 2024-12-07 MED ORDER — ASPIRIN 81 MG PO TBEC: 81.0000 mg | DELAYED_RELEASE_TABLET | Freq: Every day | ORAL | Status: AC

## 2024-12-07 MED ORDER — PERFLUTREN LIPID MICROSPHERE
1.0000 mL | INTRAVENOUS | Status: AC | PRN
  Administered 2024-12-07: 2 mL via INTRAVENOUS

## 2024-12-07 NOTE — TOC Progression Note (Signed)
 Transition of Care [facility]) - Progression Note    Patient Details  Name: Christine Schaefer MRN: [id number] Date of Birth: Aug 29, 1971  Transition of Care [facility]) CM/SW Contact  Corean ONEIDA Haddock, RN Phone Number: 12/07/2024, 12:59 PM  Clinical Narrative:      Admitted for: syncope and collapse  Admitted from:home with husband PCP: [facility] Current home health/prior home health/DME:NA  [facility] recommending outpatient PT.  Patient in agreement and would like to use [facility] outpatient.  Referral faxed.  Spoke with Zelvia at [facility] and confirmed they are in network                     Expected Discharge Plan and Services                                               Social Drivers of Health (SDOH) Interventions SDOH Screenings   Food Insecurity: No Food Insecurity (12/06/2024)  Housing: Low Risk (12/06/2024)  Transportation Needs: No Transportation Needs (12/06/2024)  Utilities: Not At Risk (12/06/2024)  Financial Resource Strain: Medium Risk (09/29/2023)   Received from [facility]  Tobacco Use: Low Risk (12/05/2024)  Recent Concern: Tobacco Use - Medium Risk (12/04/2024)   Received from [facility]    Readmission Risk Interventions     No data to display

## 2024-12-07 NOTE — Plan of Care (Signed)

## 2024-12-07 NOTE — Discharge Summary (Signed)
 Physician Discharge Summary   Patient: Christine Schaefer MRN: [id number]  DOB: 03-28-1971   Admit:     Date of Admission: 12/05/2024 Admitted from: home   Discharge: Date of discharge: 12/07/2024 Disposition: Home Condition at discharge: good  CODE STATUS: FULL CODE     Discharge Physician: Laneta Blunt, DO [facility]     PCP: Valora Lynwood FALCON, MD  Recommendations for Outpatient Follow-up:  Follow up with PCP Valora Lynwood FALCON, MD in 1-2 weeks Follow with cardiology for monitor results   Discharge Instructions     Increase activity slowly   Complete by: As directed          Discharge Diagnoses: Principal Problem:   Syncope and collapse Active Problems:   Hypokalemia   Alcohol withdrawal (HCC)   Uncontrolled type 2 diabetes mellitus with hyperglycemia, without long-term current use of insulin  (HCC)   GERD without esophagitis   Essential hypertension   Depression     Hospital course / significant events:   Christine Schaefer is a 53 y.o. female with medical history significant for essential hypertension, type 2 diabetes mellitus and alcohol abuse with alcohol withdrawal seizure for which she was recently admitted,   presented to the emergency room with acute onset of unresponsiveness when she was found down by her son.   HPI: unresponsiveness when she was found down by her son. This occurred in the afternoon 12/10 and she is not sure how long she was down. She admits to nausea and vomiting as well as headache without dizziness or blurred vision. Per H&P, she was more alert in the emergency room when she came but was significantly anxious and agitated. Her last alcoholic drink was last night 87/90.   12/10: to ED. Hypertensive, tachycardic, hyperglycemix, WBC 18, UDS no illicits, (+)barbirutate which was given in ED (phenobarb). EKG showed PSVT initially with a rate of 163.  Later on EKG showed sinus rhythm a rate of 82 with prolonged QT  interval with QTc of 518 MS and poor R wave progression. Admitted to hospitalist w/ EtOH withdrawal. CTA chest and CT head ok.  12/11: WBC WNL at 8.9, VSS, syncope w/u pending echo, cardio can place Zio monitor probably tomorrow prior to dc  12/12: Echo no concerns, telemetry has not picked up any more Afib, Zio in place, pt ok for DC home      Consultants:  none  Procedures/Surgeries: none      ASSESSMENT & PLAN:   Syncope Question related to EtOH  Question PAfib  No concerns on echocardiogram No more afib on tele  No suspicion for seizure Zio on discharge  Fall and head trauma with concussion  Concussion care discussed and will give printed instructions on discharge  NO alcohol  Follow w/ PCP  Atrial fibrillation, now NSR Afib on initial EKG Zio on discharge [facility] for now hold [facility] given not sustained, pending cardiac monitor  Aspirin on discharge  Follow w/ cardiology   Alcohol withdrawal risk ciwa   Hypokalemia Replace as needed Monitor BMP  Uncontrolled type 2 diabetes mellitus with hyperglycemia, without long-term current use of insulin  (HCC) Resume metformin    GERD without esophagitis PPI   Depression Pt not taking home Celexa - follow w/ PCP/BH   Essential hypertension Lisinopril -HCT    overweight based on BMI: Body mass index is 27.46 kg/m.SABRA Significantly low or high BMI is associated with higher medical risk.  Underweight - under 18  overweight - 25 to 29  obese - 30 or more Class 1 obesity: BMI of 30.0 to 34 Class 2 obesity: BMI of 35.0 to 39 Class 3 obesity: BMI of 40.0 to 49 Super Morbid Obesity: BMI 50-59 Super-super Morbid Obesity: BMI 60+ Healthy nutrition and physical activity advised as adjunct to other disease management and risk reduction treatments             Discharge Instructions  Allergies as of 12/07/2024   No Known Allergies      Medication List     STOP taking these medications    citalopram 40 MG  tablet Commonly known as: CELEXA   cyanocobalamin  1000 MCG tablet   folic acid  1 MG tablet Commonly known as: FOLVITE    lansoprazole 15 MG capsule Commonly known as: PREVACID   Mounjaro 2.5 MG/0.5ML Pen Generic drug: tirzepatide   multivitamin with minerals Tabs tablet   ondansetron  4 MG disintegrating tablet Commonly known as: ZOFRAN -ODT   thiamine  100 MG tablet Commonly known as: Vitamin B-1       TAKE these medications    aspirin EC 81 MG tablet Take 1 tablet (81 mg total) by mouth daily.   lisinopril -hydrochlorothiazide 20-12.5 MG tablet Commonly known as: ZESTORETIC Take 2 tablets by mouth daily.   metFORMIN 500 MG 24 hr tablet Commonly known as: GLUCOPHAGE-XR Take 1,000 mg by mouth daily.         Follow-up Information     Valora Lynwood FALCON, MD. Schedule an appointment as soon as possible for a visit.   Specialty: Family Medicine Why: hospital follow up 2-4 weeks Contact information: [address] [place] [address] [phone]         Dewane Shiner, DO. Go to.   Specialty: Cardiology Why: appt in 12/28/24 Contact information: [address] [place] [address] [phone]                 Allergies[1]   Subjective: pt reports still some weakness generally and a bit unsteady, mild headache, no N/V, no VC, no CP/palpitations, no SOB   Discharge Exam: BP (!) 150/82 (BP Location: Left Arm)   Pulse 87   Temp 98.7 F (37.1 C) (Oral)   Resp 18   Ht 5' 5 (1.651 m)   Wt 74.8 kg   SpO2 98%   BMI 27.46 kg/m  General: Pt is alert, awake, not in acute distress Cardiovascular: RRR, S1/S2 +, no rubs, no gallops Respiratory: CTA bilaterally, no wheezing, no rhonchi Abdominal: Soft, NT, ND, bowel sounds + Extremities: no edema, no cyanosis     The results of significant diagnostics from this hospitalization (including imaging, microbiology, ancillary and laboratory) are listed below for  reference.     Microbiology: No results found for this or any previous visit (from the past 240 hours).   Labs: BNP (last 3 results) No results for input(s): BNP in the last 8760 hours. Basic Metabolic Panel: Recent Labs  Lab 12/05/24 1603 12/06/24 0550 12/07/24 0512  NA 139 138 136  K 3.2* 3.4* 3.4*  CL 92* 97* 98  CO2 11* 27 24  GLUCOSE 350* 212* 164*  BUN 20 22* 16  CREATININE 1.09* 1.13* 0.80  CALCIUM  9.8 9.2 8.8*  MG 1.7  --   --    Liver Function Tests: Recent Labs  Lab 12/05/24 1603 12/07/24 0512  AST 30 27  ALT 22 18  ALKPHOS 60 48  BILITOT 0.5 0.5  PROT 7.9 6.5  ALBUMIN 4.6 3.9  No results for input(s): LIPASE, AMYLASE in the last 168 hours. No results for input(s): AMMONIA in the last 168 hours. CBC: Recent Labs  Lab 12/05/24 1603 12/06/24 0615 12/07/24 0512  WBC 18.9* 8.9 6.2  NEUTROABS 15.7*  --   --   HGB 14.4 12.0 12.0  HCT 38.7 33.6* 34.8*  MCV 94.2 94.1 96.7  PLT 318 239 181   Cardiac Enzymes: No results for input(s): CKTOTAL, CKMB, CKMBINDEX, TROPONINI in the last 168 hours. BNP: Invalid input(s): POCBNP CBG: Recent Labs  Lab 12/05/24 1601  GLUCAP 343*   D-Dimer Recent Labs    12/05/24 1603  DDIMER 2.06*   Hgb A1c Recent Labs    12/07/24 0512  HGBA1C 7.2*   Lipid Profile No results for input(s): CHOL, HDL, LDLCALC, TRIG, CHOLHDL, LDLDIRECT in the last 72 hours. Thyroid function studies Recent Labs    12/07/24 0512  TSH 1.520   Anemia work up No results for input(s): VITAMINB12, FOLATE, FERRITIN, TIBC, IRON, RETICCTPCT in the last 72 hours. Urinalysis    Component Value Date/Time   COLORURINE YELLOW (A) 09/24/2023 1258   APPEARANCEUR HAZY (A) 09/24/2023 1258   LABSPEC 1.008 09/24/2023 1258   PHURINE 6.0 09/24/2023 1258   GLUCOSEU >=500 (A) 09/24/2023 1258   HGBUR MODERATE (A) 09/24/2023 1258   BILIRUBINUR NEGATIVE 09/24/2023 1258   KETONESUR 20 (A) 09/24/2023 1258    PROTEINUR 30 (A) 09/24/2023 1258   NITRITE NEGATIVE 09/24/2023 1258   LEUKOCYTESUR NEGATIVE 09/24/2023 1258   Sepsis Labs Recent Labs  Lab 12/05/24 1603 12/06/24 0615 12/07/24 0512  WBC 18.9* 8.9 6.2   Microbiology No results found for this or any previous visit (from the past 240 hours). Imaging ECHOCARDIOGRAM COMPLETE Result Date: 12/07/2024    ECHOCARDIOGRAM REPORT   Patient Name:   KEERAT DENICOLA [facility] Date of Exam: 12/07/2024 Medical Rec #:  [id number]           Height:       65.0 in Accession #:    [id number]          Weight:       165.0 lb Date of Birth:  [birth year]          BSA:          1.823 m Patient Age:    53 years            BP:           153/73 mmHg Patient Gender: F                   HR:           76 bpm. Exam Location:  [facility] Procedure: 2D Echo, Cardiac Doppler, Color Doppler, Intracardiac Opacification            Agent, 3D Echo and Strain Analysis (Both Spectral and Color Flow            Doppler were utilized during procedure). Indications:     Atrial Fibrillation I48.91                  Syncope R55  History:         Patient has no prior history of Echocardiogram examinations.                  Risk Factors:Hypertension.  Sonographer:     Christopher Furnace Referring Phys:  [id number] LANETA BLUNT Diagnosing Phys: Blunt Dooms MD  Sonographer Comments: Global longitudinal strain was attempted. IMPRESSIONS  1.  Left ventricular ejection fraction, by estimation, is 60 to 65%. The left ventricle has normal function. The left ventricle has no regional wall motion abnormalities. Left ventricular diastolic parameters are consistent with Grade I diastolic dysfunction (impaired relaxation). The global longitudinal strain is normal.  2. Right ventricular systolic function is normal. The right ventricular size is normal.  3. The mitral valve is normal in structure. No evidence of mitral valve regurgitation. No evidence of mitral stenosis.  4. The aortic valve is normal in structure. Aortic  valve regurgitation is not visualized. No aortic stenosis is present.  5. The inferior vena cava is normal in size with greater than 50% respiratory variability, suggesting right atrial pressure of 3 mmHg. FINDINGS  Left Ventricle: Left ventricular ejection fraction, by estimation, is 60 to 65%. The left ventricle has normal function. The left ventricle has no regional wall motion abnormalities. Definity contrast agent was given IV to delineate the left ventricular  endocardial borders. Strain was performed and the global longitudinal strain is normal. The left ventricular internal cavity size was normal in size. There is no left ventricular hypertrophy. Left ventricular diastolic parameters are consistent with Grade I diastolic dysfunction (impaired relaxation). Right Ventricle: The right ventricular size is normal. No increase in right ventricular wall thickness. Right ventricular systolic function is normal. Left Atrium: Left atrial size was normal in size. Right Atrium: Right atrial size was normal in size. Pericardium: There is no evidence of pericardial effusion. Mitral Valve: The mitral valve is normal in structure. No evidence of mitral valve regurgitation. No evidence of mitral valve stenosis. MV peak gradient, 3.7 mmHg. The mean mitral valve gradient is 1.0 mmHg. Tricuspid Valve: The tricuspid valve is normal in structure. Tricuspid valve regurgitation is not demonstrated. No evidence of tricuspid stenosis. Aortic Valve: The aortic valve is normal in structure. Aortic valve regurgitation is not visualized. No aortic stenosis is present. Aortic valve mean gradient measures 4.0 mmHg. Aortic valve peak gradient measures 8.1 mmHg. Aortic valve area, by VTI measures 2.59 cm. Pulmonic Valve: The pulmonic valve was normal in structure. Pulmonic valve regurgitation is not visualized. No evidence of pulmonic stenosis. Aorta: The aortic root is normal in size and structure. Venous: The inferior vena cava is normal  in size with greater than 50% respiratory variability, suggesting right atrial pressure of 3 mmHg. IAS/Shunts: No atrial level shunt detected by color flow Doppler. Additional Comments: 3D was performed not requiring image post processing on an independent workstation and was abnormal.  LEFT VENTRICLE PLAX 2D LVIDd:         4.20 cm   Diastology LVIDs:         2.80 cm   LV e' medial:    4.24 cm/s LV PW:         1.00 cm   LV E/e' medial:  15.6 LV IVS:        1.00 cm   LV e' lateral:   4.57 cm/s LVOT diam:     2.00 cm   LV E/e' lateral: 14.4 LV SV:         69 LV SV Index:   38 LVOT Area:     3.14 cm LV IVRT:       137 msec  RIGHT VENTRICLE RV Basal diam:  3.10 cm RV Mid diam:    2.30 cm RV S prime:     11.90 cm/s TAPSE (M-mode): 2.3 cm LEFT ATRIUM             Index  RIGHT ATRIUM           Index LA diam:        3.10 cm 1.70 cm/m   RA Area:     16.40 cm LA Vol (A2C):   43.9 ml 24.08 ml/m  RA Volume:   40.70 ml  22.33 ml/m LA Vol (A4C):   45.2 ml 24.80 ml/m LA Biplane Vol: 45.3 ml 24.85 ml/m  AORTIC VALVE AV Area (Vmax):    2.39 cm AV Area (Vmean):   2.51 cm AV Area (VTI):     2.59 cm AV Vmax:           142.00 cm/s AV Vmean:          96.500 cm/s AV VTI:            0.266 m AV Peak Grad:      8.1 mmHg AV Mean Grad:      4.0 mmHg LVOT Vmax:         108.00 cm/s LVOT Vmean:        77.200 cm/s LVOT VTI:          0.219 m LVOT/AV VTI ratio: 0.82  AORTA Ao Root diam: 2.30 cm MITRAL VALVE               TRICUSPID VALVE MV Area (PHT): 2.55 cm    TR Peak grad:   10.1 mmHg MV Area VTI:   2.61 cm    TR Vmax:        159.00 cm/s MV Peak grad:  3.7 mmHg MV Mean grad:  1.0 mmHg    SHUNTS MV Vmax:       0.96 m/s    Systemic VTI:  0.22 m MV Vmean:      55.9 cm/s   Systemic Diam: 2.00 cm MV Decel Time: 297 msec MV E velocity: 66.00 cm/s MV A velocity: 84.80 cm/s MV E/A ratio:  0.78 Marsa Dooms MD Electronically signed by Marsa Dooms MD Signature Date/Time: 12/07/2024/1:22:35 PM    Final       Time  coordinating discharge: over 30 minutes  SIGNED:  Lyllie Cobbins DO [facility]       [1] No Known Allergies

## 2024-12-07 NOTE — Evaluation (Addendum)
 Occupational Therapy Evaluation Patient Details Name: Christine Schaefer MRN: [id number] DOB: Jun 29, 1971 Today's Date: 12/07/2024   History of Present Illness   Pt is a 53 year old female admitted with syncope, afib, hypokalemia     PMH significant for r essential hypertension, type 2 diabetes mellitus and alcohol abuse with alcohol withdrawal seizure for which she was recently admitted,     Clinical Impressions Chart reviewed to date, pt greeted semi supine in bed, agreeable to OT evaluation. PTA pt is generally MOD I with ADL, amb with no AD. Pt reports BLE soreness affecting optimal mobility. She does perform a toilet transfer with supervision, amb with no AD approx 200' with supervision. Educated pt re: use of DME/AE following discharge for safer ADL completion, concussion management. No further OT needs identified. Pt is left in bed, all needs met. Please re consult if there is a change in functional status.      If plan is discharge home, recommend the following:   Assist for transportation;Assistance with cooking/housework     Functional Status Assessment   Patient has had a recent decline in their functional status and demonstrates the ability to make significant improvements in function in a reasonable and predictable amount of time.     Equipment Recommendations   Tub/shower seat     Recommendations for Other Services         Precautions/Restrictions   Precautions Precautions: Fall Recall of Precautions/Restrictions: Intact Restrictions Weight Bearing Restrictions Per Provider Order: No     Mobility Bed Mobility Overal bed mobility: Modified Independent                  Transfers Overall transfer level: Needs assistance   Transfers: Sit to/from Stand Sit to Stand: Modified independent (Device/Increase time), Supervision                  Balance Overall balance assessment: Needs assistance Sitting-balance support: Feet  supported Sitting balance-Leahy Scale: Normal     Standing balance support: No upper extremity supported Standing balance-Leahy Scale: Good                             ADL either performed or assessed with clinical judgement   ADL Overall ADL's : Modified independent                                       General ADL Comments: MOD I for grooming, dressing tasks; supervision for for toilet transfer, amb approx 200' with supervision     Vision Patient Visual Report: No change from baseline       Perception         Praxis         Pertinent Vitals/Pain Pain Assessment Pain Assessment: Faces Faces Pain Scale: Hurts a little bit Pain Location: legs feel sore Pain Descriptors / Indicators: Discomfort Pain Intervention(s): Limited activity within patient's tolerance, Repositioned     Extremity/Trunk Assessment Upper Extremity Assessment Upper Extremity Assessment: Overall WFL for tasks assessed   Lower Extremity Assessment Lower Extremity Assessment: Defer to PT evaluation       Communication Communication Communication: No apparent difficulties   Cognition Arousal: Alert Behavior During Therapy: WFL for tasks assessed/performed Cognition: No apparent impairments  Following commands: Intact       Cueing  General Comments   Cueing Techniques: Verbal cues  Pt endorses feeling funny when amb, BP155/92, HR in 90s bpm after amb attempt   Exercises Other Exercises Other Exercises: edu re role of OT, role of rehab, discharge recommendations, DME for safer ADL completion   Shoulder Instructions      Home Living Family/patient expects to be discharged to:: Private residence Living Arrangements: Spouse/significant other;Children Available Help at Discharge: Family;Available 24 hours/day Type of Home: House Home Access: Stairs to enter [employer] of Steps: 2-3   Home Layout:  One level     Bathroom Shower/Tub: Producer, Television/film/video: Standard     Home Equipment: Grab bars - tub/shower          Prior Functioning/Environment Prior Level of Function : Independent/Modified Independent             Mobility Comments: amb with no AD ADLs Comments: does not drive    OT Problem List:     OT Treatment/Interventions:        OT Goals(Current goals can be found in the care plan section)       OT Frequency:       Co-evaluation              AM-PAC OT 6 Clicks Daily Activity     Outcome Measure Help from another person eating meals?: None Help from another person taking care of personal grooming?: None Help from another person toileting, which includes using toliet, bedpan, or urinal?: None Help from another person bathing (including washing, rinsing, drying)?: A Little Help from another person to put on and taking off regular upper body clothing?: None Help from another person to put on and taking off regular lower body clothing?: None 6 Click Score: 23   End of Session Nurse Communication: Mobility status  Activity Tolerance: Patient tolerated treatment well Patient left: in bed;with call bell/phone within reach                   Time: 1051-1105 OT Time Calculation (min): 14 min Charges:  OT General Charges $OT Visit: 1 Visit OT Evaluation $OT Eval Low Complexity: 1 Low  Therisa Sheffield, OTD OTR/L  12/07/2024, 11:20 AM

## 2024-12-07 NOTE — Progress Notes (Signed)
° ° ° °  [facility] REFERRAL        Occupational Therapy * Physical Therapy * Speech Therapy                           DATE 12/07/24  PATIENT NAME  Christine Schaefer    PATIENT MRN [id number]        DIAGNOSIS/DIAGNOSIS CODE SYNCOPE AND COLLAPSE312   DATE OF DISCHARGE: 12/07/24       PRIMARY CARE PHYSICIAN     Valora Agent      Dear Provider (Name: [facility] __  Fax: [phone]   I certify that I have examined this patient and that occupational/physical/speech therapy is necessary on an outpatient basis.    The patient has expressed interest in completing their recommended course of therapy at your  location.  Once a formal order from the patient's primary care physician has been obtained, please  contact him/her to schedule an appointment for evaluation at your earliest convenience.   [ x]  Physical Therapy Evaluate and Treat  [  ]  Occupational Therapy Evaluate and Treat  [  ]  Speech Therapy Evaluate and Treat         The patient's primary care physician (listed above) must furnish and be responsible for a formal order such that the recommended services may be furnished while under the primary physician's care, and that the plan of care will be established and reviewed every 30 days (or more often if condition necessitates).

## 2024-12-07 NOTE — Plan of Care (Signed)

## 2024-12-07 NOTE — Progress Notes (Signed)
*  PRELIMINARY RESULTS* Echocardiogram 2D Echocardiogram has been performed.  Christine Schaefer 12/07/2024, 8:56 AM

## 2024-12-07 NOTE — Evaluation (Signed)
 Physical Therapy Evaluation Patient Details Name: Christine Schaefer MRN: [id number] DOB: 29-Jun-1971 Today's Date: 12/07/2024  History of Present Illness  Pt is a 53 year old female admitted with syncope, afib, hypokalemia     PMH significant for r essential hypertension, type 2 diabetes mellitus and alcohol abuse with alcohol withdrawal seizure for which she was recently admitted,  Clinical Impression  Patient admitted with the above. PTA, patient lives with husband and was independent with mobility. Currently, patient demonstrates balance and strength deficits. Requires CGA-supervision for ambulation in hallway with no AD. Requires close guarding with addition of head turns (horizontal and vertical). Able to negotiate stairs with single handrail and CGA for safety. Patient will benefit from skilled PT services during acute stay to address listed deficits. Patient will benefit from ongoing therapy at discharge to maximize functional independence and safety.         If plan is discharge home, recommend the following: Help with stairs or ramp for entrance;Assist for transportation   Can travel by private vehicle        Equipment Recommendations None recommended by PT  Recommendations for Other Services       Functional Status Assessment Patient has had a recent decline in their functional status and demonstrates the ability to make significant improvements in function in a reasonable and predictable amount of time.     Precautions / Restrictions Precautions Precautions: Fall Recall of Precautions/Restrictions: Intact Restrictions Weight Bearing Restrictions Per Provider Order: No      Mobility  Bed Mobility Overal bed mobility: Modified Independent                  Transfers Overall transfer level: Modified independent                      Ambulation/Gait Ambulation/Gait assistance: Contact guard assist, Supervision Gait Distance (Feet): 200  Feet Assistive device: None Gait Pattern/deviations: Step-through pattern, Decreased stride length, Drifts right/left, Staggering left Gait velocity: decreased     General Gait Details: staggering L with addition of head turns (horizontal and vertical). CGA during head turns  Stairs Stairs: Yes Stairs assistance: Contact guard assist Stair Management: One rail Left, Step to pattern, Forwards Number of Stairs: 4    Wheelchair Mobility     Tilt Bed    Modified Rankin (Stroke Patients Only)       Balance Overall balance assessment: Needs assistance Sitting-balance support: Feet supported Sitting balance-Leahy Scale: Normal     Standing balance support: No upper extremity supported, During functional activity Standing balance-Leahy Scale: Fair                               Pertinent Vitals/Pain Pain Assessment Pain Assessment: Faces Faces Pain Scale: Hurts a little bit Pain Location: legs feel sore Pain Descriptors / Indicators: Discomfort Pain Intervention(s): Monitored during session    Home Living Family/patient expects to be discharged to:: Private residence Living Arrangements: Spouse/significant other;Children Available Help at Discharge: Family;Available 24 hours/day Type of Home: House Home Access: Stairs to enter   [employer] of Steps: 2-3   Home Layout: One level Home Equipment: Grab bars - tub/shower      Prior Function Prior Level of Function : Independent/Modified Independent             Mobility Comments: amb with no AD ADLs Comments: does not drive     Extremity/Trunk Assessment   Upper  Extremity Assessment Upper Extremity Assessment: Defer to OT evaluation    Lower Extremity Assessment Lower Extremity Assessment: Generalized weakness       Communication   Communication Communication: No apparent difficulties    Cognition Arousal: Alert Behavior During Therapy: WFL for tasks assessed/performed    PT - Cognitive impairments: No apparent impairments                         Following commands: Intact       Cueing Cueing Techniques: Verbal cues     General Comments General comments (skin integrity, edema, etc.): Pt endorses feeling funny when amb, BP155/92, HR in 90s bpm after amb attempt    Exercises     Assessment/Plan    PT Assessment Patient needs continued PT services  PT Problem List Decreased strength;Decreased activity tolerance;Decreased balance;Decreased mobility       PT Treatment Interventions DME instruction;Functional mobility training;Therapeutic activities;Therapeutic exercise;Balance training;Gait training;Neuromuscular re-education    PT Goals (Current goals can be found in the Care Plan section)  Acute Rehab PT Goals Patient Stated Goal: to get better PT Goal Formulation: With patient Time For Goal Achievement: 12/21/24 Potential to Achieve Goals: Good    Frequency Min 1X/week     Co-evaluation               AM-PAC PT 6 Clicks Mobility  Outcome Measure Help needed turning from your back to your side while in a flat bed without using bedrails?: None Help needed moving from lying on your back to sitting on the side of a flat bed without using bedrails?: None Help needed moving to and from a bed to a chair (including a wheelchair)?: None Help needed standing up from a chair using your arms (e.g., wheelchair or bedside chair)?: None Help needed to walk in hospital room?: A Little Help needed climbing 3-5 steps with a railing? : A Little 6 Click Score: 22    End of Session   Activity Tolerance: Patient tolerated treatment well Patient left: in bed;with call bell/phone within reach;with family/visitor present Nurse Communication: Mobility status PT Visit Diagnosis: Unsteadiness on feet (R26.81);Muscle weakness (generalized) (M62.81)    Time: 8886-8877 PT Time Calculation (min) (ACUTE ONLY): 9 min   Charges:   PT  Evaluation $PT Eval Low Complexity: 1 Low   PT General Charges $$ ACUTE PT VISIT: 1 Visit         Maryanne Finder, PT, DPT Physical Therapist - [facility]  [facility]  Ledonna Dormer A Michaelyn Wall 12/07/2024, 12:22 PM

## 2024-12-11 ENCOUNTER — Inpatient Hospital Stay: Admit: 2024-12-11 | Discharge: 2024-12-11 | Payer: MEDICAID

## 2024-12-11 DIAGNOSIS — N6320 Unspecified lump in the left breast, unspecified quadrant: Secondary | ICD-10-CM

## 2024-12-11 DIAGNOSIS — N644 Mastodynia: Secondary | ICD-10-CM

## 2025-01-07 ENCOUNTER — Emergency Department
Admission: EM | Admit: 2025-01-07 | Discharge: 2025-01-08 | Disposition: A | Payer: MEDICAID | Attending: Emergency Medicine | Admitting: Emergency Medicine

## 2025-01-07 ENCOUNTER — Other Ambulatory Visit: Payer: Self-pay

## 2025-01-07 ENCOUNTER — Encounter: Payer: Self-pay | Admitting: *Deleted

## 2025-01-07 ENCOUNTER — Emergency Department: Payer: MEDICAID

## 2025-01-07 DIAGNOSIS — T1491XA Suicide attempt, initial encounter: Secondary | ICD-10-CM

## 2025-01-07 DIAGNOSIS — I10 Essential (primary) hypertension: Secondary | ICD-10-CM | POA: Insufficient documentation

## 2025-01-07 DIAGNOSIS — Y908 Blood alcohol level of 240 mg/100 ml or more: Secondary | ICD-10-CM | POA: Diagnosis not present

## 2025-01-07 DIAGNOSIS — T50902A Poisoning by unspecified drugs, medicaments and biological substances, intentional self-harm, initial encounter: Secondary | ICD-10-CM | POA: Diagnosis not present

## 2025-01-07 DIAGNOSIS — F1090 Alcohol use, unspecified, uncomplicated: Secondary | ICD-10-CM | POA: Insufficient documentation

## 2025-01-07 DIAGNOSIS — F332 Major depressive disorder, recurrent severe without psychotic features: Secondary | ICD-10-CM | POA: Insufficient documentation

## 2025-01-07 DIAGNOSIS — T464X2A Poisoning by angiotensin-converting-enzyme inhibitors, intentional self-harm, initial encounter: Secondary | ICD-10-CM | POA: Diagnosis present

## 2025-01-07 LAB — COMPREHENSIVE METABOLIC PANEL WITH GFR
ALT: 27 U/L (ref 0–44)
AST: 31 U/L (ref 15–41)
Albumin: 4.6 g/dL (ref 3.5–5.0)
Alkaline Phosphatase: 50 U/L (ref 38–126)
Anion gap: 16 — ABNORMAL HIGH (ref 5–15)
BUN: 13 mg/dL (ref 6–20)
CO2: 22 mmol/L (ref 22–32)
Calcium: 9.6 mg/dL (ref 8.9–10.3)
Chloride: 101 mmol/L (ref 98–111)
Creatinine, Ser: 0.82 mg/dL (ref 0.44–1.00)
GFR, Estimated: >60 mL/min
Glucose, Bld: 299 mg/dL — ABNORMAL HIGH (ref 70–99)
Potassium: 3.8 mmol/L (ref 3.5–5.1)
Sodium: 139 mmol/L (ref 135–145)
Total Bilirubin: 0.2 mg/dL (ref 0.0–1.2)
Total Protein: 7.4 g/dL (ref 6.5–8.1)

## 2025-01-07 LAB — CBC WITH DIFFERENTIAL/PLATELET
Abs Immature Granulocytes: 0.03 K/uL (ref 0.00–0.07)
Basophils Absolute: 0 K/uL (ref 0.0–0.1)
Basophils Relative: 0 %
Eosinophils Absolute: 0.4 K/uL (ref 0.0–0.5)
Eosinophils Relative: 5 %
HCT: 34.9 % — ABNORMAL LOW (ref 36.0–46.0)
Hemoglobin: 12.5 g/dL (ref 12.0–15.0)
Immature Granulocytes: 0 %
Lymphocytes Relative: 34 %
Lymphs Abs: 2.5 K/uL (ref 0.7–4.0)
MCH: 33.5 pg (ref 26.0–34.0)
MCHC: 35.8 g/dL (ref 30.0–36.0)
MCV: 93.6 fL (ref 80.0–100.0)
Monocytes Absolute: 0.6 K/uL (ref 0.1–1.0)
Monocytes Relative: 7 %
Neutro Abs: 4 K/uL (ref 1.7–7.7)
Neutrophils Relative %: 54 %
Platelets: 164 K/uL (ref 150–400)
RBC: 3.73 MIL/uL — ABNORMAL LOW (ref 3.87–5.11)
RDW: 13.1 % (ref 11.5–15.5)
WBC: 7.5 K/uL (ref 4.0–10.5)
nRBC: 0 % (ref 0.0–0.2)

## 2025-01-07 LAB — URINE DRUG SCREEN
Amphetamines: NEGATIVE
Barbiturates: NEGATIVE
Benzodiazepines: NEGATIVE
Cocaine: NEGATIVE
Fentanyl: NEGATIVE
Methadone Scn, Ur: NEGATIVE
Opiates: NEGATIVE
Tetrahydrocannabinol: NEGATIVE

## 2025-01-07 LAB — URINALYSIS, ROUTINE W REFLEX MICROSCOPIC
Bilirubin Urine: NEGATIVE
Glucose, UA: >=500 mg/dL — AB
Hgb urine dipstick: NEGATIVE
Ketones, ur: NEGATIVE mg/dL
Leukocytes,Ua: NEGATIVE
Nitrite: NEGATIVE
Protein, ur: NEGATIVE mg/dL
Specific Gravity, Urine: 1.003 — ABNORMAL LOW (ref 1.005–1.030)
pH: 6 (ref 5.0–8.0)

## 2025-01-07 LAB — SALICYLATE LEVEL: Salicylate Lvl: <7 mg/dL — ABNORMAL LOW (ref 7.0–30.0)

## 2025-01-07 LAB — ETHANOL: Alcohol, Ethyl (B): 312 mg/dL

## 2025-01-07 LAB — MAGNESIUM: Magnesium: 1.3 mg/dL — ABNORMAL LOW (ref 1.7–2.4)

## 2025-01-07 LAB — ACETAMINOPHEN LEVEL: Acetaminophen (Tylenol), Serum: <10 ug/mL — ABNORMAL LOW (ref 10–30)

## 2025-01-07 MED ORDER — SODIUM CHLORIDE 0.9 % IV BOLUS
1000.0000 mL | Freq: Once | INTRAVENOUS | Status: AC
  Administered 2025-01-07: 1000 mL via INTRAVENOUS

## 2025-01-07 NOTE — ED Notes (Signed)
 [place] [facility] controlled called.

## 2025-01-07 NOTE — BH Assessment (Signed)
 Comprehensive Clinical Assessment (CCA) Screening, Triage and Referral Note  01/07/2025 Christine Schaefer [facility] [id number] Recommendations for Services/Supports/Treatments: Christine Schaefer consult/Disposition pending. Devere HERO. Christine Schaefer is a 54 year old English speaking, Caucasian female. Pt presented to [facility] ED voluntary. Per triage note: Pt brought in via ems from home. Pt is intoxicated, iv in place. Fsbs 423 per ems. Pt took 6 12.5 lisinoprril for self-harm.  On assessment the pt. was depressed and drowsy. Pt had slowed psychomotor activity, however, pt attempted to answer assessment questions to the best of her ability. Pt reported that she has had thoughts of SI for years, stating, I just want to go to sleep. Pt unable to identify her current needs. Pt reports drinking alcohol, 2-3 times per week; last use was around 2 PM 01/07/25. Pt identified her main stressor as relationship conflict, explaining that her husband cut her off financially a couple of years ago and she has nothing. Pt reported that she lost her job about 1 year ago and that things have worsened for her since then as her husband controls everything. Pt expressed feelings of sadness and hopelessness. Pt was not responding to internal/external stimuli, nor did pt. present with delusional thoughts. Pt had soft speech and was oriented x4. Pt presented with a depressed mood; affect was flat. Pt had a disheveled appearance. Pt denied HI/AV/H. The patient is presenting with features consistent with dorsal vagal activation within the autonomic nervous system.   Chief Complaint:  Chief Complaint  Patient presents with   Drug Overdose   Visit Diagnosis: MDD, recurrent severe without psychosis  Patient Reported Information How did you hear about us ? No data recorded What Is the Reason for Your Visit/Call Today? No data recorded How Long Has This Been Causing You Problems? No data recorded What Do You Feel Would Help You the Most Today? No data  recorded  Have You Recently Had Any Thoughts About Hurting Yourself? No data recorded Are You Planning to Commit Suicide/Harm Yourself At This time? No data recorded  Have you Recently Had Thoughts About Hurting Someone Christine Schaefer? No data recorded Are You Planning to Harm Someone at This Time? No data recorded Explanation: No data recorded  Have You Used Any Alcohol or Drugs in the Past 24 Hours? No data recorded How Long Ago Did You Use Drugs or Alcohol? No data recorded What Did You Use and How Much? No data recorded  Do You Currently Have a Therapist/Psychiatrist? No data recorded Name of Therapist/Psychiatrist: No data recorded  Have You Been Recently Discharged From Any Office Practice or Programs? No data recorded Explanation of Discharge From Practice/Program: No data recorded   CCA Screening Triage Referral Assessment Type of Contact: No data recorded Telemedicine Service Delivery:   Is this Initial or Reassessment?   Date Telepsych consult ordered in [facility]:    Time Telepsych consult ordered in [facility]:    Location of Assessment: No data recorded Provider Location: No data recorded   Collateral Involvement: No data recorded  Does Patient Have a Court Appointed Legal Guardian? No data recorded Name and Contact of Legal Guardian: No data recorded If Minor and Not Living with Parent(s), Who has Custody? No data recorded Is CPS involved or ever been involved? No data recorded Is APS involved or ever been involved? No data recorded  Patient Determined To Be At Risk for Harm To Self or Others Based on Review of Patient Reported Information or Presenting Complaint? No data recorded Method: No data recorded Availability of Means: No data recorded  Intent: No data recorded Notification Required: No data recorded Additional Information for Danger to Others Potential: No data recorded Additional Comments for Danger to Others Potential: No data recorded Are There Guns or Other Weapons in  Your Home? No data recorded Types of Guns/Weapons: No data recorded Are These Weapons Safely Secured?                            No data recorded Who Could Verify You Are Able To Have These Secured: No data recorded Do You Have any Outstanding Charges, Pending Court Dates, Parole/Probation? No data recorded Contacted To Inform of Risk of Harm To Self or Others: No data recorded  Does Patient Present under Involuntary Commitment? No data recorded   [place] of Residence: No data recorded  Patient Currently Receiving the Following Services: No data recorded  Determination of Need: No data recorded  Options For Referral: No data recorded  Disposition Recommendation per psychiatric provider: Pending psych consult.   Christine Schaefer R Christine Schaefer, LCAS

## 2025-01-07 NOTE — ED Notes (Signed)
 Labs resent.

## 2025-01-07 NOTE — Discharge Instructions (Addendum)
" °  Make a follow-up appointment with vascular surgery, a neurologist to discuss the plaque noted incidentally on your CT imaging.  Return to the ER if you develop worsening symptoms or any other concerns  IMPRESSION: 1. No acute intracranial abnormality. 2. Calcified atherosclerotic plaque within the cavernous/supraclinoid internal carotid arteries and intradural vertebral arteries. 3. Mild global parenchymal volume loss, slightly greater than typically expected for age. 4. Mild periventricular white matter changes, likely reflecting small vessel ischemia. IMPRESSION: 1. No acute findings. 2. Multilevel facet arthrosis, left greater than right, without high-grade canal stenosis or neural foraminal narrowing, 3. moderate atherosclerotic calcification at the carotid bifurcations bilaterally. "

## 2025-01-07 NOTE — ED Provider Notes (Addendum)
 "  [facility] Provider Note    Event Date/Time   First MD Initiated Contact with Patient 01/07/25 2045     (approximate)   History   Drug Overdose   HPI  Christine Schaefer is a 54 y.o. female who comes in with concerns for SI.  Patient reportedly drank a lot of alcohol and then took 6 of 12.5 lisinopril 's around 4 PM and came in with concerns for SI attempt.  According to EMS there was no signs of trauma and that she was alert and answering all questions.  Patient was more somnolent but was able to wake up and did state that she took lisinopril .  She is not sure if she could have hit her head.  Physical Exam   Triage Vital Signs: ED Triage Vitals  Encounter Vitals Group     BP      Girls Systolic BP Percentile      Girls Diastolic BP Percentile      Boys Systolic BP Percentile      Boys Diastolic BP Percentile      Pulse      Resp      Temp      Temp src      SpO2      Weight      Height      Head Circumference      Peak Flow      Pain Score      Pain Loc      Pain Education      Exclude from Growth Chart     Most recent vital signs: Vitals:   01/07/25 2103  BP: (!) 178/87  Pulse: (!) 113  Resp: 19  Temp: 98.8 F (37.1 C)  SpO2: 98%     General: Awake, no distress.  CV:  Good peripheral perfusion.  Resp:  Normal effort.  Abd:  No distention.  Soft nontender Other:  Awakes with nasal stimulation and painful stimulation.  Seems to be moving all extremities able to state her name and follow commands in all extremities.   ED Results / Procedures / Treatments   Labs (all labs ordered are listed, but only abnormal results are displayed) Labs Reviewed  CBC WITH DIFFERENTIAL/PLATELET - Abnormal; Notable for the following components:      Result Value   RBC 3.73 (*)    HCT 34.9 (*)    All other components within normal limits  URINALYSIS, ROUTINE W REFLEX MICROSCOPIC - Abnormal; Notable for the following components:   Color,  Urine COLORLESS (*)    APPearance CLEAR (*)    Specific Gravity, Urine 1.003 (*)    Glucose, UA >=500 (*)    Bacteria, UA RARE (*)    All other components within normal limits  URINE DRUG SCREEN  ACETAMINOPHEN  LEVEL  ETHANOL  COMPREHENSIVE METABOLIC PANEL WITH GFR  MAGNESIUM   SALICYLATE LEVEL     EKG  My interpretation of EKG:  Sinus tachycardia rate of 105 without any ST elevation or T wave inversions, normal intervals  RADIOLOGY I have reviewed the ct personally and interpreted no intracranial hemorrhage   PROCEDURES:  Critical Care performed: No  .1-3 Lead EKG Interpretation  Performed by: Ernest Ronal BRAVO, MD Authorized by: Ernest Ronal BRAVO, MD     Interpretation: abnormal     ECG rate:  100   ECG rate assessment: tachycardic     Rhythm: sinus tachycardia     Ectopy: none  Conduction: normal      MEDICATIONS ORDERED IN ED: Medications  sodium chloride  0.9 % bolus 1,000 mL (has no administration in time range)     IMPRESSION / MDM / ASSESSMENT AND PLAN / ED COURSE  I reviewed the triage vital signs and the nursing notes.   Patient's presentation is most consistent with acute presentation with potential threat to life or bodily function.   Patient comes in slightly tachycardic with concerns for alcohol intoxication as well as overdose on lisinopril  as of SI attempt.  Will place patient under IVC.  At this time she seems to be protecting her airway.  She is unclear she could have had any trauma and given her intermittent sleepiness CT imaging ordered evaluate for any intracranial hemorrhage, cervical fracture.  Will place a 4-hour Tylenol  level as well.  Will keep patient on the cardiac monitor.  Given the tachycardia we will give some IV fluids.  Psychiatric consultation will be ordered.   Salicylate, Tylenol  negative CMP shows elevated glucose of 299 but normal bicarb although anion gap is slightly elevated patient is getting some IV fluids so suspect that is  more likely from dehydration.  Patient's magnesium  is low and will give some IV repletion.  Her urine is without evidence of UTI CBC reassuring.  CT head and neck are negative for acute findings.  Incidental findings were discussed with patient but given she is intoxicated, unclear how long likely she is to remember this I did give her numbers if she is discharged for outpatient follow-up and copies of the report  The patient has been placed in psychiatric observation due to the need to provide a safe environment for the patient while obtaining psychiatric consultation and evaluation, as well as ongoing medical and medication management to treat the patient's condition.  The patient has been placed under full IVC at this time.    The patient is on the cardiac monitor to evaluate for evidence of arrhythmia and/or significant heart rate changes.      FINAL CLINICAL IMPRESSION(S) / ED DIAGNOSES   Final diagnoses:  Alcohol use  Suicide attempt [facility])     Rx / DC Orders   ED Discharge Orders     None        Note:  This document was prepared using Dragon voice recognition software and may include unintentional dictation errors.   Ernest Ronal BRAVO, MD 01/07/25 2221    Ernest Ronal BRAVO, MD 01/07/25 2248  "

## 2025-01-07 NOTE — ED Notes (Signed)
Tts with pt now

## 2025-01-07 NOTE — ED Notes (Signed)
 Pt to bathroom with assistance.

## 2025-01-07 NOTE — ED Triage Notes (Signed)
 Pt brought in via ems from home.  Pt is intoxicated, iv in place.  Fsbs 423 per ems.  Pt took 6 12.5 lisinoprril  for self harm.  Md at bedside.  Pt in hallway bed.

## 2025-01-07 NOTE — ED Notes (Signed)
 Pt brought in via ems from home with drug od and etoh use.  Pt reports taking lisinopril  for self harm.  Pt sleepy on arrival to er.  Pt in hallway bed.  Iv fluids infusing md at bedside.  Pt reports si.

## 2025-01-08 ENCOUNTER — Other Ambulatory Visit: Payer: Self-pay

## 2025-01-08 ENCOUNTER — Inpatient Hospital Stay
Admission: AD | Admit: 2025-01-08 | Discharge: 2025-01-13 | DRG: 885 | Disposition: A | Discharge status: Home / Self Care | Payer: MEDICAID | Attending: Psychiatry | Admitting: Psychiatry

## 2025-01-08 DIAGNOSIS — F411 Generalized anxiety disorder: Secondary | ICD-10-CM | POA: Diagnosis present

## 2025-01-08 DIAGNOSIS — Z9151 Personal history of suicidal behavior: Secondary | ICD-10-CM | POA: Diagnosis not present

## 2025-01-08 DIAGNOSIS — F419 Anxiety disorder, unspecified: Secondary | ICD-10-CM | POA: Diagnosis present

## 2025-01-08 DIAGNOSIS — T50902A Poisoning by unspecified drugs, medicaments and biological substances, intentional self-harm, initial encounter: Secondary | ICD-10-CM | POA: Diagnosis not present

## 2025-01-08 DIAGNOSIS — Z9152 Personal history of nonsuicidal self-harm: Secondary | ICD-10-CM

## 2025-01-08 DIAGNOSIS — T464X2A Poisoning by angiotensin-converting-enzyme inhibitors, intentional self-harm, initial encounter: Secondary | ICD-10-CM | POA: Diagnosis not present

## 2025-01-08 DIAGNOSIS — Z79899 Other long term (current) drug therapy: Secondary | ICD-10-CM

## 2025-01-08 DIAGNOSIS — Z560 Unemployment, unspecified: Secondary | ICD-10-CM

## 2025-01-08 DIAGNOSIS — Z87891 Personal history of nicotine dependence: Secondary | ICD-10-CM

## 2025-01-08 DIAGNOSIS — Z7982 Long term (current) use of aspirin: Secondary | ICD-10-CM | POA: Diagnosis not present

## 2025-01-08 DIAGNOSIS — F332 Major depressive disorder, recurrent severe without psychotic features: Principal | ICD-10-CM | POA: Diagnosis present

## 2025-01-08 DIAGNOSIS — I10 Essential (primary) hypertension: Secondary | ICD-10-CM | POA: Diagnosis present

## 2025-01-08 DIAGNOSIS — Z7984 Long term (current) use of oral hypoglycemic drugs: Secondary | ICD-10-CM

## 2025-01-08 DIAGNOSIS — F329 Major depressive disorder, single episode, unspecified: Principal | ICD-10-CM | POA: Diagnosis present

## 2025-01-08 LAB — ACETAMINOPHEN LEVEL: Acetaminophen (Tylenol), Serum: <10 ug/mL — ABNORMAL LOW (ref 10–30)

## 2025-01-08 MED ORDER — TRAZODONE HCL 50 MG PO TABS
50.0000 mg | ORAL_TABLET | Freq: Every evening | ORAL | Status: DC | PRN
  Administered 2025-01-09 – 2025-01-12 (×4): 50 mg via ORAL
  Filled 2025-01-08 (×5): qty 1

## 2025-01-08 MED ORDER — OLANZAPINE 10 MG IM SOLR: 5.0000 mg | Freq: Three times a day (TID) | INTRAMUSCULAR | Status: DC | PRN

## 2025-01-08 MED ORDER — OLANZAPINE 5 MG PO TBDP: 5.0000 mg | ORAL_TABLET | Freq: Three times a day (TID) | ORAL | Status: DC | PRN

## 2025-01-08 MED ORDER — MAGNESIUM HYDROXIDE 400 MG/5ML PO SUSP: 30.0000 mL | Freq: Every day | ORAL | Status: DC | PRN

## 2025-01-08 MED ORDER — ALUM & MAG HYDROXIDE-SIMETH 200-200-20 MG/5ML PO SUSP
30.0000 mL | ORAL | Status: DC | PRN
  Administered 2025-01-12: 30 mL via ORAL
  Filled 2025-01-08: qty 30

## 2025-01-08 MED ORDER — HYDRALAZINE HCL 25 MG PO TABS
25.0000 mg | ORAL_TABLET | Freq: Three times a day (TID) | ORAL | Status: DC | PRN
  Administered 2025-01-08 – 2025-01-13 (×6): 25 mg via ORAL
  Filled 2025-01-08 (×8): qty 1

## 2025-01-08 MED ORDER — ASPIRIN 81 MG PO TBEC
81.0000 mg | DELAYED_RELEASE_TABLET | Freq: Every day | ORAL | Status: DC
  Administered 2025-01-09 – 2025-01-13 (×5): 81 mg via ORAL
  Filled 2025-01-08 (×5): qty 1

## 2025-01-08 MED ORDER — ACETAMINOPHEN 325 MG PO TABS
650.0000 mg | ORAL_TABLET | Freq: Four times a day (QID) | ORAL | Status: DC | PRN
  Administered 2025-01-12 – 2025-01-13 (×2): 650 mg via ORAL
  Filled 2025-01-08 (×2): qty 2

## 2025-01-08 MED ORDER — HYDROXYZINE HCL 25 MG PO TABS
25.0000 mg | ORAL_TABLET | Freq: Four times a day (QID) | ORAL | Status: DC | PRN
  Administered 2025-01-08 – 2025-01-13 (×4): 25 mg via ORAL
  Filled 2025-01-08 (×5): qty 1

## 2025-01-08 NOTE — ED Notes (Signed)
 Report attempted at this time.

## 2025-01-08 NOTE — ED Notes (Signed)
IVC pending consult   

## 2025-01-08 NOTE — ED Notes (Signed)
 Lab sent

## 2025-01-08 NOTE — ED Notes (Signed)
 Patient dressed out by this tech and Cherene, [employer]. Pt changed into hospital safe scrubs. Patient belongings include:   Black shirt Gray pants [employer] bra Gold ring Tan hair tie  [employer]  Patient has two bags on belongings cart.

## 2025-01-08 NOTE — Tx Team (Signed)
 Initial Treatment Plan 01/08/2025 6:23 PM Dalaney Needle [facility] FMW:[id number]    PATIENT STRESSORS: Occupational concerns   Substance abuse     PATIENT STRENGTHS: Average or above average intelligence  Communication skills  Motivation for treatment/growth  Physical Health  Supportive family/friends  Work skills    PATIENT IDENTIFIED PROBLEMS: Depression  Loneliness   Lost job                 DISCHARGE CRITERIA:  Ability to meet basic life and health needs Adequate post-discharge living arrangements Medical problems require only outpatient monitoring Motivation to continue treatment in a less acute level of care Verbal commitment to aftercare and medication compliance  PRELIMINARY DISCHARGE PLAN: Attend aftercare/continuing care group Outpatient therapy Return to previous living arrangement  PATIENT/FAMILY INVOLVEMENT: This treatment plan has been presented to and reviewed with the patient, Christine Schaefer, and/or family member,.  The patient and family have been given the opportunity to ask questions and make suggestions.  Christine Zachary Sandifer, RN 01/08/2025, 6:23 PM

## 2025-01-08 NOTE — BH Assessment (Signed)
 Patient is to be admitted to [facility] on today 01/08/25 by Dr. Jadapalle.  Attending Physician will be Dr. Jadapalle.   Patient has been assigned to room 315, by [facility] Charge Nurse Orlean.    ER staff is aware of the admission: Olam, ER Secretary   Dr. Willo, ER MD  Leonor, Patient's Nurse  Curtistine, Patient Access.

## 2025-01-08 NOTE — ED Notes (Signed)
 Pt transferred to BMU at this time. Pt transferred with tech and security at this time. Pt aware of admission and transfer. Pt calm and cooperative. All belongings sent with pt. Report called to Dollie Sandifer RN.

## 2025-01-08 NOTE — ED Notes (Signed)
 Patient in room watching  Tv

## 2025-01-08 NOTE — Plan of Care (Signed)
   Problem: Education: Goal: Verbalization of understanding the information provided will improve Outcome: Progressing   Problem: Safety: Goal: Periods of time without injury will increase Outcome: Progressing

## 2025-01-08 NOTE — ED Notes (Addendum)
 Alert, NAD, calm, interactiove, resps e/u, sp[eaking in clear complete sentences. Denies pain, sob, nausea. Verbalizes Got sleep, not hungry. Mentions maybe some dizziness, might need assistance getting up. Denies SI at this time.

## 2025-01-08 NOTE — Consult Note (Signed)
 [facility] Psychiatric Consult Initial  Patient Name: .Christine Schaefer  MRN: [id number]  DOB: 03/31/1971  Consult Order details:  Orders (From admission, onward)     Start     Ordered   01/07/25 2048  CONSULT TO CALL ACT TEAM       Ordering Provider: Ernest Ronal BRAVO, MD  Provider:  (Not yet assigned)  Question:  Reason for Consult?  Answer:  Psych consult   01/07/25 2047   01/07/25 2048  IP CONSULT TO PSYCHIATRY       Ordering Provider: Ernest Ronal BRAVO, MD  Provider:  (Not yet assigned)  Question:  Reason for consult:  Answer:  Medication management   01/07/25 2047             Mode of Visit: In person    Psychiatry Consult Evaluation  Service Date: January 08, 2025 LOS:  LOS: 0 days  Chief Complaint I was drinking  Primary Psychiatric Diagnoses   Intentional overdose [facility])   Assessment   Patient is recommended for inpatient psychiatric admission following medical clearance based on the following clinical concerns: Patient made a suicide attempt by intentionally overdosing on her blood pressure medication in combination with alcohol, even though she expresses ambivalence about whether she wanted to die. Her statement I felt like I had to escape reflects hopelessness, desperation, and impaired problem-solving at the time of the overdose, which are strong risk factors for future suicide attempts. Patient has history of previous suicide attempt in the 1990s, demonstrating a pattern of turning to self-harm during times of severe distress. Patient is currently experiencing multiple acute psychosocial stressors including unemployment, financial crisis, and escalating depression, which remain unresolved and continue to place her at elevated risk. Patient has untreated depression and anxiety that have persisted for several years and worsened recently, with her antidepressant medication (Celexa ) having been discontinued 2 months ago due to medical concerns, leaving her without  psychiatric treatment during a high-risk period. Patient is using alcohol as a maladaptive coping mechanism, which increases impulsivity, worsens depression, and significantly elevates suicide risk. Patient has no established outpatient mental health provider, no current psychiatric medication management, and no therapeutic support system in place to maintain safety in the community.   Patient is currently under involuntary commitment. The treatment plan and recommendation for inpatient psychiatric admission were discussed with patient, who verbalized understanding of the plan. Patient will be transferred to inpatient psychiatric facility once medically cleared for continued stabilization and comprehensive treatment.    Diagnoses:  Active Hospital problems: Active Problems:   Intentional overdose [facility])    Plan   ## Psychiatric Medication Recommendations:  Defer further management to inpatient psychiatric unit  ## Medical Decision Making Capacity: Not specifically addressed in this encounter  ## Further Work-up:   -- most recent EKG on 01/07/2025 had QtC of 457 -- Pertinent labwork reviewed earlier this admission includes: CBC, CMP, ethanol, acetaminophen  level, salicylate level, magnesium    ## Disposition:-- Patient is recommended for inpatient psychiatric admission  ## Behavioral / Environmental: - No specific recommendations at this time.     ## Safety and Observation Level:  - Based on my clinical evaluation, I estimate the patient to be at low risk of self harm in the current setting. - At this time, we recommend  routine. This decision is based on my review of the chart including patient's history and current presentation, interview of the patient, mental status examination, and consideration of suicide risk including evaluating suicidal ideation, plan, intent,  suicidal or self-harm behaviors, risk factors, and protective factors. This judgment is based on our ability to directly  address suicide risk, implement suicide prevention strategies, and develop a safety plan while the patient is in the clinical setting. Please contact our team if there is a concern that risk level has changed.  CSSR Risk Category:C-SSRS RISK CATEGORY: Low Risk  Suicide Risk Assessment: Patient has following modifiable risk factors for suicide: lack of access to outpatient mental health resources, current symptoms: anxiety/panic, insomnia, impulsivity, anhedonia, hopelessness, and triggering events, which we are addressing by Recommending inpatient psychiatric admission for further monitoring and stabilization. Patient has following non-modifiable or demographic risk factors for suicide: history of suicide attempt Patient has the following protective factors against suicide: Supportive family and Minor children in the home  Thank you for this consult request. Recommendations have been communicated to the primary team.  We will sign off at this time.   Zelda Sharps, NP        History of Present Illness  Relevant Aspects of Hospital ED   Patient Report:  Psychiatry assessed patient today. Patient reported drinking alcohol while experiencing depression related to inability to find employment, increasing bills, and financial troubles, which escalated her depressive symptoms. Patient stated she believes her alcohol intake contributed to her taking more than the prescribed amount of her blood pressure medication. When asked directly if this was a suicide attempt, patient stated I do not know if I wanted to kill myself, but I felt like I had to escape, which reflects ambivalence about death and suicidal intent at the time of the overdose. Patient reported a previous suicide attempt in the early 1990s.  Patient reported she lives at home with her husband and 2 sons. She described the home environment as fine, though noted her husband works frequently and is not around much. Patient is not currently  established with any outpatient mental health providers and reported she has never been hospitalized for mental health reasons previously. Patient denied current suicidal ideation. She denied homicidal ideation as well as auditory or visual hallucinations.  Patient reported she was previously prescribed Celexa  for depression. However, she fell and hit her head 2 months ago, and her doctors discontinued the Celexa  due to concerns it was interfering with her blood pressure and causing her heart rate to be too low. Patient endorsed experiencing depression over the last few years as well as anxiety symptoms. Patient denied access to firearms. Patient endorsed occasional alcohol use. She reported history of one seizure in the past but has not had any seizures since that time. Patient denied any other recreational drug use or any upcoming known legal charges. Patient reported struggling emotionally with the rejection of not being able to find employment despite ongoing efforts.      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 Living Situation: With husband and 2 sons  Access to weapons/lethal means: Denied access to firearms  Substance History Alcohol: Endorsed Last Drink prior to ED arrival History of alcohol withdrawal seizures reported history of seizures but reported unsure if it was related to withdrawal History of  DT's see above Tobacco: Denied Illicit drugs: Denied Prescription drug abuse: Denied Rehab hx: Denied  Exam  Findings  Physical Exam: Reviewed and agree with the physical exam findings conducted by the medical provider Vital Signs:  Temp:  [98.5 F (36.9 C)-98.8 F (37.1 C)] 98.5 F (36.9 C) (01/13 1050) Pulse Rate:  [72-113] 82 (01/13 1200) Resp:  [10-25] 13 (01/13 1200) BP: (109-178)/(57-103) 160/103 (01/13 1000) SpO2:  [94 %-100 %] 97 % (01/13 1200) Weight:  [74 kg] 74 kg (01/12 2047) Blood pressure (!) 160/103, pulse 82, temperature 98.5 F (36.9 C), temperature source Oral, resp. rate 13, height 5' 5 (1.651 m), weight 74 kg, SpO2 97%. Body mass index is 27.15 kg/m.    Mental Status Exam: General Appearance: Casual  Orientation:  Full (Time, Place, and Person)  Memory:  Fair  Concentration:  Concentration: Good  Recall:  Fair  Attention  Good  Eye Contact:  Good  Speech:  Clear and Coherent  Language:  Good  Volume:  Normal  Mood: Depressed  Affect:  Congruent and Depressed  Thought Process:  Coherent, Goal Directed, and Linear  Thought Content:  Logical  Suicidal Thoughts:  No  Homicidal Thoughts:  No  Judgement:  Impaired  Insight:  Present  Psychomotor Activity:  Normal  Akathisia:  No  Fund of Knowledge:  Fair      Assets:  Engineer, Maintenance Social Support  Cognition:  WNL  ADL's:  Intact  AIMS (if indicated):        Other History   These have been pulled in through the EMR, reviewed, and updated if appropriate.  Family History:  The patient's family history includes Breast cancer in her maternal aunt and paternal aunt.  Medical History: Past Medical History:  Diagnosis Date   Hypertension     Surgical History: Past Surgical History:  Procedure Laterality Date   EXPLORATORY LAPAROTOMY       Medications:  Current Medications[1]  Allergies: Allergies[2]  Zelda Sharps, NP This note was created using Dragon dictation software. Please excuse any inadvertent transcription errors. Case was discussed with supervising physician Dr.  Jadapalle who is agreeable with current plan.       [1] No current facility-administered medications for this encounter.  Current Outpatient Medications:    aspirin  EC 81 MG tablet, Take 1 tablet (81 mg total) by mouth daily., Disp: , Rfl:    lisinopril -hydrochlorothiazide  (ZESTORETIC ) 20-12.5 MG tablet, Take 2 tablets by mouth daily., Disp: , Rfl:    metFORMIN (GLUCOPHAGE-XR) 500 MG 24 hr tablet, Take 1,000 mg by mouth daily. (Patient not taking: Reported on 12/05/2024), Disp: , Rfl:  [2] No Known Allergies

## 2025-01-08 NOTE — Group Note (Signed)
 Date:  01/08/2025 Time:  9:06 PM   Additional Comments:  Did not attend group.  Butler LITTIE Gelineau 01/08/2025, 9:06 PM

## 2025-01-08 NOTE — ED Notes (Addendum)
 Spoke with Franky from [employer], updated him on patients status as well as the EKG. Franky said that he would be closing the case on his side. Further recommendation was for patient to see psych.

## 2025-01-08 NOTE — ED Notes (Signed)
IVC moved to BHU 

## 2025-01-08 NOTE — Group Note (Signed)
 Recreation Therapy Group Note   Group Topic:Relaxation  Group Date: 01/08/2025 Start Time: 1525 End Time: 1605 Facilitators: Celestia Jeoffrey BRAVO, LRT, CTRS Location: Courtyard  Group Description: Meditation. LRT and patients discussed what they know about meditation and mindfulness. LRT played a Deep Breathing Meditation exercise script for patients to follow along to. LRT and patients discussed how meditation and deep breathing can be used as a coping skill post--discharge to help manage symptoms of stress.   Goal Area(s) Addressed: Patient will practice using relaxation technique. Patient will identify a new coping skill.  Patient will follow multistep directions to reduce anxiety and stress.   Affect/Mood: N/A   Participation Level: Did not attend    Clinical Observations/Individualized Feedback: Patient did not attend.   Plan: Continue to engage patient in RT group sessions 2-3x/week.   Jeoffrey BRAVO Celestia, LRT, CTRS 01/08/2025 5:16 PM

## 2025-01-08 NOTE — Progress Notes (Signed)
 54 year old female patient admitted with MDD.Patient appears sad and depressed but cooperative with admission assessment. Patient states  it was not intentional but I was impaired at the time of overdose. Patient denies SI,HI and AVH at this time. Skin assessment and body search done. No contraband found. Oriented to unit and made comfortable in room. Support and encouragement given.

## 2025-01-08 NOTE — ED Notes (Addendum)
 Patient dressed out in [employer] per [place] area rules with [employer] and [place], [place].  2 bags of belongings  1 out of 2 bag: Black Shirt [place] Pants [employer] bra Gold ring Tan Hair Tie  2 out of 2 bag: [employer]

## 2025-01-08 NOTE — ED Notes (Signed)
 Lunch tray provided to p

## 2025-01-08 NOTE — ED Notes (Signed)
 Awake, alert, NAD, calm, sitting on edge of bed.

## 2025-01-08 NOTE — ED Notes (Signed)
 Sleeping, NAD, calm, NSR on monitor, HR 97.

## 2025-01-08 NOTE — ED Notes (Signed)
 Pt refused breakfast

## 2025-01-08 NOTE — Group Note (Deleted)
 Date:  01/08/2025 Time:  4:06 PM  Group Topic/Focus:  Goals Group:   The focus of this group is to help patients establish daily goals to achieve during treatment and discuss how the patient can incorporate goal setting into their daily lives to aide in recovery.    Participation Level:  Active  Participation Quality:  Appropriate  Affect:  Appropriate  Cognitive:  Alert  Insight: Appropriate  Engagement in Group:  Engaged  Modes of Intervention:  Activity, Discussion, and Education  Additional Comments:    Christine Schaefer 01/08/2025, 4:06 PM

## 2025-01-09 DIAGNOSIS — F329 Major depressive disorder, single episode, unspecified: Secondary | ICD-10-CM

## 2025-01-09 DIAGNOSIS — F332 Major depressive disorder, recurrent severe without psychotic features: Secondary | ICD-10-CM | POA: Diagnosis not present

## 2025-01-09 MED ORDER — ADULT MULTIVITAMIN W/MINERALS CH
1.0000 | ORAL_TABLET | Freq: Every day | ORAL | Status: DC
  Administered 2025-01-09 – 2025-01-13 (×5): 1 via ORAL
  Filled 2025-01-09 (×5): qty 1

## 2025-01-09 MED ORDER — ONDANSETRON 4 MG PO TBDP: 4.0000 mg | ORAL_TABLET | Freq: Four times a day (QID) | ORAL | Status: AC | PRN

## 2025-01-09 MED ORDER — CHLORDIAZEPOXIDE HCL 25 MG PO CAPS
25.0000 mg | ORAL_CAPSULE | Freq: Every day | ORAL | Status: AC
  Administered 2025-01-13: 25 mg via ORAL
  Filled 2025-01-09: qty 1

## 2025-01-09 MED ORDER — VENLAFAXINE HCL ER 37.5 MG PO CP24
37.5000 mg | ORAL_CAPSULE | Freq: Every day | ORAL | Status: DC
  Administered 2025-01-09 – 2025-01-10 (×2): 37.5 mg via ORAL
  Filled 2025-01-09 (×2): qty 1

## 2025-01-09 MED ORDER — CHLORDIAZEPOXIDE HCL 25 MG PO CAPS
25.0000 mg | ORAL_CAPSULE | ORAL | Status: AC
  Administered 2025-01-11 – 2025-01-12 (×2): 25 mg via ORAL
  Filled 2025-01-09 (×2): qty 1

## 2025-01-09 MED ORDER — CHLORDIAZEPOXIDE HCL 25 MG PO CAPS
25.0000 mg | ORAL_CAPSULE | Freq: Three times a day (TID) | ORAL | Status: AC
  Administered 2025-01-10 – 2025-01-11 (×3): 25 mg via ORAL
  Filled 2025-01-09 (×3): qty 1

## 2025-01-09 MED ORDER — THIAMINE HCL 100 MG/ML IJ SOLN
100.0000 mg | Freq: Once | INTRAMUSCULAR | Status: AC
  Administered 2025-01-09: 100 mg via INTRAMUSCULAR
  Filled 2025-01-09: qty 2

## 2025-01-09 MED ORDER — LOPERAMIDE HCL 2 MG PO CAPS: 2.0000 mg | ORAL_CAPSULE | ORAL | Status: AC | PRN

## 2025-01-09 MED ORDER — CHLORDIAZEPOXIDE HCL 25 MG PO CAPS
25.0000 mg | ORAL_CAPSULE | Freq: Four times a day (QID) | ORAL | Status: AC | PRN
  Filled 2025-01-09: qty 1

## 2025-01-09 MED ORDER — CHLORDIAZEPOXIDE HCL 25 MG PO CAPS
25.0000 mg | ORAL_CAPSULE | Freq: Four times a day (QID) | ORAL | Status: AC
  Administered 2025-01-09 – 2025-01-10 (×4): 25 mg via ORAL
  Filled 2025-01-09 (×4): qty 1

## 2025-01-09 MED ORDER — HYDROXYZINE HCL 25 MG PO TABS
25.0000 mg | ORAL_TABLET | Freq: Four times a day (QID) | ORAL | Status: AC | PRN
  Administered 2025-01-11: 25 mg via ORAL
  Filled 2025-01-09: qty 1

## 2025-01-09 NOTE — Group Note (Signed)
 [facility] LCSW Group Therapy Note   Group Date: 01/09/2025 Start Time: 1300 End Time: 1400   Type of Therapy/Topic:  Group Therapy:  Emotion Regulation  Participation Level:  Did Not Attend   Mood:  Description of Group:    The purpose of this group is to assist patients in learning to regulate negative emotions and experience positive emotions. Patients will be guided to discuss ways in which they have been vulnerable to their negative emotions. These vulnerabilities will be juxtaposed with experiences of positive emotions or situations, and patients challenged to use positive emotions to combat negative ones. Special emphasis will be placed on coping with negative emotions in conflict situations, and patients will process healthy conflict resolution skills.  Therapeutic Goals: Patient will identify two positive emotions or experiences to reflect on in order to balance out negative emotions:  Patient will label two or more emotions that they find the most difficult to experience:  Patient will be able to demonstrate positive conflict resolution skills through discussion or role plays:   Summary of Patient Progress:   X    Therapeutic Modalities:   Cognitive Behavioral Therapy Feelings Identification Dialectical Behavioral Therapy   Sherryle JINNY Margo, LCSW

## 2025-01-09 NOTE — Group Note (Signed)
 Date:  01/09/2025 Time:  12:28 PM  Group Topic/Focus:  Building Self Esteem:   The Focus of this group is helping patients become aware of the effects of self-esteem on their lives, the things they and others do that enhance or undermine their self-esteem, seeing the relationship between their level of self-esteem and the choices they make and learning ways to enhance self-esteem.    Participation Level:  Did Not Attend   Christine Schaefer June 01/09/2025, 12:28 PM

## 2025-01-09 NOTE — [facility] Counselor (Signed)
 Adult Comprehensive Assessment  Patient ID: Christine Schaefer, female   DOB: [birth year], 54 y.o.   MRN: [id number]  Information Source: Information source: Patient  Current Stressors:  Patient states their primary concerns and needs for treatment are:: 02/10/2025 started drinking in the morning and by afternoon I had the idea to take pills. Pt endorsed taking a handful of Lisinopril . Patient states their goals for this hospitilization and ongoing recovery are:: Work on depression and anxiety. Educational / Learning stressors: Doing online course in recruiting. Employment / Job issues: Lost job in September. Family Relationships: None reported Financial / Lack of resources (include bankruptcy): Pt is unemployed. Housing / Lack of housing: None reported Physical health (include injuries & life threatening diseases): None reported Social relationships: None reported Substance abuse: She endorsed some alcohol use. Bereavement / Loss: None reported  Living/Environment/Situation:  Living Arrangements: Spouse/significant other, Other relatives Living conditions (as described by patient or guardian): Right outside of [place]. Who else lives in the home?: Pt, her husband, and two grandsons. How long has patient lived in current situation?: Since 02-10-97. What is atmosphere in current home: Comfortable, Loving, Supportive, Other (Comment) (For the most part it's good.)  Family History:  Marital status: Married Number of Years Married: 30 (30 years going on 26 in March.) What types of issues is patient dealing with in the relationship?: Don't communicate a lot with each other as far as opening up about feelings and whatnot. Additional relationship information: Pt shared that her own negative thinking causes issues at time. Are you sexually active?: Yes What is your sexual orientation?: Straight. Does patient have children?: Yes How many children?: 3 (Two sons and a daughter.) How  is patient's relationship with their children?: It's pretty good.  Childhood History:  By whom was/is the patient raised?: Both parents Additional childhood history information: Typical Gen Xer, latch key kid. Pretty much left to my own devices. Description of patient's relationship with caregiver when they were a child: Pretty good. She stated that her father was not the lovey dovey type. Patient's description of current relationship with people who raised him/her: Father is deceased 02-10-22). Mother: Good, worked together for a long time. She stated that her siblings have limited her contact with mother. How were you disciplined when you got in trouble as a child/adolescent?: Grounded, sent to my room, told what to do. Does patient have siblings?: Yes Number of Siblings: 3 (Pt is the baby.) Description of patient's current relationship with siblings: We don't have good relationships. Did patient suffer any verbal/emotional/physical/sexual abuse as a child?: No Did patient suffer from severe childhood neglect?: No Has patient ever been sexually abused/assaulted/raped as an adolescent or adult?: No Was the patient ever a victim of a crime or a disaster?: No Witnessed domestic violence?: No Has patient been affected by domestic violence as an adult?: No  Education:  Highest grade of school patient has completed: Engineer, agricultural, Associates in bookkeeping and accounting. Currently a student?: Yes Name of school: [employer] How long has the patient attended?: Either end of November or beginning of December. 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 Where was the Patient Employed at that Time?: 11 years. Has Patient ever Been in the  [employer]?: No  Financial Resources:   Surveyor, Quantity resources: [employer], Income from spouse  Does patient have a representative payee or guardian?: No  Alcohol/Substance Abuse:   What has been your use of drugs/alcohol within the last 12 months?: Pt shared that she may have a glass of wine in the evenings. She stated that she does her recreational drinking on weekends. Pt endorsed drinking mainly wine or vodka of unspecified amount. If attempted suicide, did drugs/alcohol play a role in this?: Yes (Pt took handful of lisinopril  on Monday. She stated that My husband didn't know what it would do. I didn't know what it would do.) Alcohol/Substance Abuse Treatment Hx: Denies past history If yes, describe treatment and response: N/A Is patient motivated for change?: No Does patient live in an environment that promotes recovery or serves as an obstacle to recovery?: No Are others in the home using alcohol or other substances?: Yes Describe others in the home that use alcohol or other substances: Husband drinks alcohol. Daughter who lives next door drinks alcohol. Are significant others in the home willing to participate in the patient's care?: Yes Describe significant others willing to participate in the patient's care: Pt describes husband and children as part of her support. Has alcohol/substance abuse ever caused legal problems?: No  Social Support System:   Patient's Community Support System: Good Describe Community Support System: My husband, my kids. Type of faith/religion: Pt denied. How does patient's faith help to cope with current illness?: N/A  Leisure/Recreation:   Do You Have Hobbies?: Yes Leisure and Hobbies: I craft, I love to cook. Me and my husband like antique shopping.  Strengths/Needs:   What is the patient's perception of their strengths?: I'm very knowledgeable, multitasker, like being around people and meeting new people on a limited scale. Patient states these  barriers may affect/interfere with their treatment: Only barrier I can see is work if I find a job. Patient states these barriers may affect their return to the community: Pt denied any barriers.  Discharge Plan:   Currently receiving community mental health services: No Patient states concerns and preferences for aftercare planning are: Pt is open to referral for outpatient mental health services. Patient states they will know when they are safe and ready for discharge when: When I get the depression, anxiety under control. I don't think it will be that long. Does patient have access to transportation?: Yes Does patient have financial barriers related to discharge medications?: No Will patient be returning to same living situation after discharge?: Yes  Summary/Recommendations:   Summary and Recommendations (to be completed by the evaluator): Patient is a 54 year old, married, [place] [facility] She reported that she came into the hospital after taking overdose of Lisinopril  in the context of alcohol use. Pt stated that her depression and anxiety have been increasing since she lost her job in September. She shared that she has been drinking to mitigate these feelings. During treatment team meeting she expressed her goal to work on anxiety and depression. Pt lives at home with her husband and two grandsons. She plans to return home upon discharge. Stressors were noted as loss of job in September, lack of finances, increasing alcohol use, and not being able to find a job despite doing more training in her field. She shared that on the day she took the pills she had received three to four rejections emails which was her final straw. Pt denied any history of abuse or trauma. She reported some alcohol use but appeared to minimize her use. Pt stated that she drinks  recreationally on the weekends and was unable or unwilling to specify how much she was drinking on the  weekends. She denied feeling that she needed any substance use specific treatment and stated that she feels if her anxiety and depression are addressed then her alcohol use will be fine. Pt denied receiving any current outpatient mental health services but is open to referral upon discharge. Recommendations include: crisis stabilization, therapeutic milieu, encourage group attendance and participation, medication management for mood stabilization and development of a comprehensive mental wellness/sobriety plan.  Nadara JONELLE Fam. 01/09/2025

## 2025-01-09 NOTE — Group Note (Signed)
 Date:  01/09/2025 Time:  8:38 PM  Group Topic/Focus:  Rediscovering Joy:   The focus of this group is to explore various ways to relieve stress in a positive manner.    Participation Level:  Did Not Attend  Participation Quality:  none  Affect:  none  Cognitive:  none  Insight: None  Engagement in Group:  none  Modes of Intervention:  none  Additional Comments:  none   Amando Chaput 01/09/2025, 8:38 PM

## 2025-01-09 NOTE — Plan of Care (Addendum)
 Pt A & O X4. Denies SI, HI, AVH and pain when assessed. Presents with flat affect, fair eye contact, logical, soft speech and is ambulatory with steady gait. Reports poor sleep last night I kept waking up, I couldn't stay asleep. Educated about PRN Trazodone  50 mg at bedtime I didn't know that. Rates her anxiety 3/10, depression and anger both 0/10. BP elevated, assigned provider made aware; no new orders received at this time. Safety checks maintained at Q 15 minutes intervals without incident. Tolerates medications, meals and fluids well. Emotional support, encouragement and reassurance offered.  Problem: Coping: Goal: Ability to demonstrate self-control will improve Outcome: Progressing   Problem: Activity: Goal: Sleeping patterns will improve Outcome: Not Progressing

## 2025-01-09 NOTE — BH IP Treatment Plan (Signed)
 Interdisciplinary Treatment and Diagnostic Plan Update  01/09/2025 Time of Session: 10:38 Christine Schaefer MRN: [id number]  Principal Diagnosis: MDD (major depressive disorder)  Secondary Diagnoses: Principal Problem:   MDD (major depressive disorder)   Current Medications:  Current Facility-Administered Medications  Medication Dose Route Frequency Provider Last Rate Last Admin   acetaminophen  (TYLENOL ) tablet 650 mg  650 mg Oral Q6H PRN Smith, Annie B, NP       alum & mag hydroxide-simeth (MAALOX/MYLANTA) 200-200-20 MG/5ML suspension 30 mL  30 mL Oral Q4H PRN Smith, Annie B, NP       aspirin  EC tablet 81 mg  81 mg Oral Daily Smith, Annie B, NP   81 mg at 01/09/25 9063   hydrALAZINE  (APRESOLINE ) tablet 25 mg  25 mg Oral Q8H PRN Jadapalle, Sree, MD   25 mg at 01/08/25 1724   hydrOXYzine  (ATARAX ) tablet 25 mg  25 mg Oral Q6H PRN Jadapalle, Sree, MD   25 mg at 01/08/25 1630   magnesium  hydroxide (MILK OF MAGNESIA) suspension 30 mL  30 mL Oral Daily PRN Smith, Annie B, NP       OLANZapine  (ZYPREXA ) injection 5 mg  5 mg Intramuscular TID PRN Smith, Annie B, NP       OLANZapine  zydis (ZYPREXA ) disintegrating tablet 5 mg  5 mg Oral TID PRN Smith, Annie B, NP       traZODone  (DESYREL ) tablet 50 mg  50 mg Oral QHS PRN Smith, Annie B, NP       PTA Medications: Medications Prior to Admission  Medication Sig Dispense Refill Last Dose/Taking   aspirin  EC 81 MG tablet Take 1 tablet (81 mg total) by mouth daily.      lisinopril -hydrochlorothiazide  (ZESTORETIC ) 20-12.5 MG tablet Take 2 tablets by mouth daily.      metFORMIN (GLUCOPHAGE-XR) 500 MG 24 hr tablet Take 1,000 mg by mouth daily. (Patient not taking: Reported on 12/05/2024)       Patient Stressors: Occupational concerns   Substance abuse    Patient Strengths: Average or above average intelligence  Communication skills  Motivation for treatment/growth  Physical Health  Supportive family/friends  Work skills   Treatment  Modalities: Medication Management, Group therapy, Case management,  1 to 1 session with clinician, Psychoeducation, Recreational therapy.   Physician Treatment Plan for Primary Diagnosis: MDD (major depressive disorder) Long Term Goal(s):     Short Term Goals:    Medication Management: Evaluate patient's response, side effects, and tolerance of medication regimen.  Therapeutic Interventions: 1 to 1 sessions, Unit Group sessions and Medication administration.  Evaluation of Outcomes: Not Met  Physician Treatment Plan for Secondary Diagnosis: Principal Problem:   MDD (major depressive disorder)  Long Term Goal(s):     Short Term Goals:       Medication Management: Evaluate patient's response, side effects, and tolerance of medication regimen.  Therapeutic Interventions: 1 to 1 sessions, Unit Group sessions and Medication administration.  Evaluation of Outcomes: Not Met   RN Treatment Plan for Primary Diagnosis: MDD (major depressive disorder) Long Term Goal(s): Knowledge of disease and therapeutic regimen to maintain health will improve  Short Term Goals: Ability to remain free from injury will improve, Ability to verbalize frustration and anger appropriately will improve, Ability to demonstrate self-control, Ability to participate in decision making will improve, Ability to verbalize feelings will improve, Ability to disclose and discuss suicidal ideas, Ability to identify and develop effective coping behaviors will improve, and Compliance with prescribed medications will improve  Medication  Management: RN will administer medications as ordered by provider, will assess and evaluate patient's response and provide education to patient for prescribed medication. RN will report any adverse and/or side effects to prescribing provider.  Therapeutic Interventions: 1 on 1 counseling sessions, Psychoeducation, Medication administration, Evaluate responses to treatment, Monitor vital signs  and CBGs as ordered, Perform/monitor CIWA, COWS, AIMS and Fall Risk screenings as ordered, Perform wound care treatments as ordered.  Evaluation of Outcomes: Not Met   LCSW Treatment Plan for Primary Diagnosis: MDD (major depressive disorder) Long Term Goal(s): Safe transition to appropriate next level of care at discharge, Engage patient in therapeutic group addressing interpersonal concerns.  Short Term Goals: Engage patient in aftercare planning with referrals and resources, Increase social support, Increase ability to appropriately verbalize feelings, Increase emotional regulation, Facilitate acceptance of mental health diagnosis and concerns, Facilitate patient progression through stages of change regarding substance use diagnoses and concerns, Identify triggers associated with mental health/substance abuse issues, and Increase skills for wellness and recovery  Therapeutic Interventions: Assess for all discharge needs, 1 to 1 time with Social worker, Explore available resources and support systems, Assess for adequacy in community support network, Educate family and significant other(s) on suicide prevention, Complete Psychosocial Assessment, Interpersonal group therapy.  Evaluation of Outcomes: Not Met   Progress in Treatment: Attending groups: No. Participating in groups: No. Taking medication as prescribed: Yes. Toleration medication: Yes. Family/Significant other contact made: No, will contact:  when given permission.  Patient understands diagnosis: Yes. Discussing patient identified problems/goals with staff: Yes. Medical problems stabilized or resolved: Yes. Denies suicidal/homicidal ideation: Yes. Issues/concerns per patient self-inventory: No. Other: none.  New problem(s) identified: No, Describe:  none identified.  New Short Term/Long Term Goal(s): medication management for mood stabilization; elimination of SI thoughts; development of comprehensive mental  wellness/sobriety plan.  Patient Goals: Work on anxiety and depression.    Discharge Plan or Barriers: CSW will assist pt with development of an appropriate aftercare/discharge plan.   Reason for Continuation of Hospitalization: Anxiety Depression Medication stabilization Suicidal ideation  Estimated Length of Stay: 1-7 days  Last 3 [place] Suicide Severity Risk Score: Flowsheet Row Admission (Current) from 01/08/2025 in [facility] INPATIENT BEHAVIORAL MEDICINE ED from 01[facility]07/2025 in [facility] Emergency Department at [facility] ED to Hosp-Admission (Discharged) from 12/05/2024 in [facility] GENERAL SURGERY  C-SSRS RISK CATEGORY Low Risk Low Risk No Risk    Last PHQ 2/9 Scores:     No data to display          Scribe for Treatment Team: Nadara JONELLE Fam, LCSW 01/09/2025 11:03 AM

## 2025-01-09 NOTE — [facility] Suicide Risk Assessment (Signed)
 [facility] Admission Suicide Risk Assessment   Nursing information obtained from:  Patient Demographic factors:  Unemployed, Caucasian Current Mental Status:  NA Loss Factors:  NA Historical Factors:  Impulsivity Risk Reduction Factors:  Living with another person, especially a relative  Total Time spent with patient: 30 minutes Principal Problem: MDD (major depressive disorder) Diagnosis:  Principal Problem:   MDD (major depressive disorder)  Subjective Data: Christine Schaefer is a 54 year old English speaking, Caucasian female. Pt presented to [facility] ED voluntary. Per triage note: Pt brought in via ems from home. Pt is intoxicated, iv in place. Fsbs 423 per ems. Pt took 6 12.5 lisinoprril for self-harm. On assessment the pt. was depressed and drowsy. Pt had slowed psychomotor activity, however, pt attempted to answer assessment questions to the best of her ability. Pt reported that she has had thoughts of SI for years, stating, I just want to go to sleep.. Patient is admitted to  psych unit with Q15 min safety monitoring. Multidisciplinary team approach is offered. Medication management; group/milieu therapy is offered.   Continued Clinical Symptoms:  Alcohol Use Disorder Identification Test Final Score (AUDIT): 6 The Alcohol Use Disorders Identification Test, Guidelines for Use in Primary Care, Second Edition.  World [employer] Writer [facility]). Score between 0-7:  no or low risk or alcohol related problems. Score between 8-15:  moderate risk of alcohol related problems. Score between 16-19:  high risk of alcohol related problems. Score 20 or above:  warrants further diagnostic evaluation for alcohol dependence and treatment.   CLINICAL FACTORS:   Depression:   Impulsivity   Musculoskeletal: Strength & Muscle Tone: within normal limits Gait & Station: normal Patient leans: N/A  Psychiatric Specialty Exam:  Presentation  General Appearance:  Appropriate for Environment  Eye  Contact: Fair  Speech: Clear and Coherent  Speech Volume: Decreased  Handedness:No data recorded  Mood and Affect  Mood: Dysphoric; Irritable  Affect: Depressed; Flat   Thought Process  Thought Processes: Coherent  Descriptions of Associations:Intact  Orientation:Full (Time, Place and Person)  Thought Content:Illogical  History of Schizophrenia/Schizoaffective disorder:No data recorded Duration of Psychotic Symptoms:No data recorded Hallucinations:Hallucinations: None  Ideas of Reference:None  Suicidal Thoughts:Suicidal Thoughts: Yes, Passive  Homicidal Thoughts:Homicidal Thoughts: No   Sensorium  Memory: Immediate Fair; Remote Fair  Judgment: Impaired  Insight: Shallow   Executive Functions  Concentration: Fair  Attention Span: Fair  Recall: [employer] of Knowledge: Fair  Language: Fair   Psychomotor Activity  Psychomotor Activity: Psychomotor Activity: Normal   Assets  Assets: Communication Skills; Resilience   Sleep  Sleep: Sleep: Fair    Physical Exam: Physical Exam ROS Blood pressure (!) 159/91, pulse 86, temperature 98 F (36.7 C), temperature source Oral, resp. rate 18, height 5' 4 (1.626 m), weight 78 kg, SpO2 100%. Body mass index is 29.52 kg/m.   COGNITIVE FEATURES THAT CONTRIBUTE TO RISK:  None    SUICIDE RISK:   Minimal: No identifiable suicidal ideation.  Patients presenting with no risk factors but with morbid ruminations; may be classified as minimal risk based on the severity of the depressive symptoms  PLAN OF CARE: Patient is admitted to psych unit with Q15 min safety monitoring. Multidisciplinary team approach is offered. Medication management; group/milieu therapy is offered.   I certify that inpatient services furnished can reasonably be expected to improve the patient's condition.   Allyn Foil, MD 01/09/2025, 9:41 PM

## 2025-01-09 NOTE — H&P (Addendum)
 " Psychiatric Admission Assessment Adult  Patient Identification: Christine Schaefer MRN:  [id number] Date of Evaluation:  01/09/2025 Chief Complaint:  MDD (major depressive disorder) [F32.9]  Copied from ED consult: Christine Schaefer is a 54 year old female who presented to the Emergency department on 01/07/2025 for concerns of SI. Patient reportedly drank a lot of alcohol and then took 6 of 12.5 lisinopril 's around 4 PM and came in with concerns for SI attempt. According to EMS there was no signs of trauma and that she was alert and answering all questions.   History of Present Illness: : Christine Schaefer is a 54 year old female who presented to the Emergency department on 01/07/2025 for concerns of SI. Patient reportedly drank a lot of alcohol and then took 6 of 12.5 lisinopril 's around 4 PM. Patient has a past medical history of anxiety and depression. Patient used to take Celexa  daily, however, her PCP removed Celexa  due to her underlying health issues at the time. Patient reports multiple episodes of passing out and seizure like activity which is why she was told to stop Celexa  2 months ago. Patient states feelings of depression and anxiety since she lost her job in September 2025, which she then started to numb the pain by drinking alcohol, consisting of vodka and wine three to four times weekly and before bed to assist with her sleep. Patient reports she does not get drunk every time she drinks, it just feels like a normal hangover. Denies DT's, seizures from alcohol withdrawals, cannabis, tobacco, and other drug use. Patient reports within the last few weeks of poor sleep, appetite loss, feeling hopeless, having little to no interest in her craft activities that she usually enjoys, and decreased energy and motivation. Patient reports that she did not have intentions to kill herself, however she did try to in the early 90's because of relationship problems. Denies homicidal thoughts, racing  thoughts, unable to multitask, insomnia for days, and no distractibility. Patients chronic worries involve finding a job, the health of her family and financial stability. She lives at home with her husband and two sons, and states that her husbands income is not enough to pay all of the bills. She denies headaches, muscle aches, panic attacks, physical or sexual trauma, hypervigilance, or nightmares/flashbacks. Patient states since her PCP discontinued the Celexa , she feels that her depression and anxiety has worsened. 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 Patient denies outpatient psychiatrist or therapist, but is interested in the services. Denies previous psychiatric hospitalization or current psychiatric medication. Patient reports that her maternal grandmother, grandfather, and uncle were alcoholics, as well as her father and two paternal aunts. Denies any family mental health problems, no family died by suicide. Patient reports no current legal charges and no guns in the household, however, her husband collects antique knives that are put away.   Did the patient present with any abnormal findings indicating the need for additional neurological or psychological testing?  NO  Total Time spent with patient: 1 hour Sleep  Sleep:Sleep: Fair  Past Psychiatric History: Major Depressive Disorder and Generalized Anxiety disorder Psychiatric History: Information collected from patient chart and patient   Prev Dx/Sx: GAD and MDD Current Psych Provider: Denies.  Home Meds (current): Denies.  Previous Med Trials: Celexa .  Therapy: Denies.   Prior Psych Hospitalization: Denies.   Prior Self Harm: Once, in the early 1990's due  to relationship problems.  Prior Violence: Denies.  Family Psych History: Denies.  Family Hx suicide: Denies.   Social History:  Developmental Hx: Denies.  Educational Hx: Associates  Degree.  Occupational Hx: Lost her job in September 2025. Pt reports applying to many jobs over the past few months.  Legal Hx: Denies.  Living Situation: Patient lives in a house with her husband and two sons.  Access to weapons/lethal means: Denies guns. Only antique knives.    Substance History Alcohol: Patient reports drinking 3-4 times a week and before bed since losing her job in September 2025.  Type of alcohol: wine and vodka Last Drink: January 12th, 2026. Number of drinks per day: Patient unsure of how much vodka or wine  History of alcohol withdrawal seizures. Denies.  History of DT's: Denies.  Tobacco: Denies.  Illicit drugs: Denies.  Prescription drug abuse: Denies.  Rehab hx: Patient reports of going one day to a rehab facility to help with alcohol but didn't think she needed it.  Is the patient at risk to self? Yes.    Has the patient been a risk to self in the past 6 months? Yes.    Has the patient been a risk to self within the distant past? No.  Is the patient a risk to others? No.  Has the patient been a risk to others in the past 6 months? No.  Has the patient been a risk to others within the distant past? No.   Columbia Scale:  Flowsheet Row Admission (Current) from 01/08/2025 in [facility] INPATIENT BEHAVIORAL MEDICINE ED from 01/07/2025 in [facility] Emergency Department at [facility] ED to Hosp-Admission (Discharged) from 12/05/2024 in [facility]  C-SSRS RISK CATEGORY Low Risk Low Risk No Risk     Past Medical History:  Past Medical History:  Diagnosis Date   Hypertension     Past Surgical History:  Procedure Laterality Date   EXPLORATORY LAPAROTOMY     Family History:  Family History  Problem Relation Age of Onset   Breast cancer Maternal Aunt    Breast cancer Paternal Aunt     Social History:  Social History   Substance and Sexual Activity  Alcohol Use Yes   Comment: 3 or 4 cans two or three times a  week.     Social History   Substance and Sexual Activity  Drug Use Never      Allergies:  Allergies[1] Lab Results:  Results for orders placed or performed during the hospital encounter of 01/07/25 (from the past 48 hours)  Acetaminophen  level     Status: Abnormal   Collection Time: 01/07/25 10:00 PM  Result Value Ref Range   Acetaminophen  (Tylenol ), Serum <10 (L) 10 - 30 ug/mL    Comment: (NOTE) Toxic concentrations can be more effectively related to post dose interval; >200, >100, and >50 ug/mL serum concentrations correspond to toxic concentrations at 4, 8, and 12 hours post dose, respectively.  Performed at [facility], [address] [place], [place] [address]   Ethanol     Status: Abnormal   Collection Time: 01/07/25 10:00 PM  Result Value Ref Range   Alcohol, Ethyl (B) 312 (HH) <15 mg/dL    Comment: Critical Value, Read Back and verified with AMY COYNE @2242  ON 01/07/25 SKL (NOTE) For medical purposes only. Performed at [facility], [address], [place], [place] [address]   Comprehensive metabolic panel with GFR     Status: Abnormal  Collection Time: 01/07/25 10:00 PM  Result Value Ref Range   Sodium 139 135 - 145 mmol/L   Potassium 3.8 3.5 - 5.1 mmol/L   Chloride 101 98 - 111 mmol/L   CO2 22 22 - 32 mmol/L   Glucose, Bld 299 (H) 70 - 99 mg/dL    Comment: Glucose reference range applies only to samples taken after fasting for at least 8 hours.   BUN 13 6 - 20 mg/dL   Creatinine, Ser 9.17 0.44 - 1.00 mg/dL   Calcium  9.6 8.9 - 10.3 mg/dL   Total Protein 7.4 6.5 - 8.1 g/dL   Albumin 4.6 3.5 - 5.0 g/dL   AST 31 15 - 41 U/L    Comment: HEMOLYSIS AT THIS LEVEL MAY AFFECT RESULT   ALT 27 0 - 44 U/L   Alkaline Phosphatase 50 38 - 126 U/L   Total Bilirubin 0.2 0.0 - 1.2 mg/dL   GFR, Estimated >39 >39 mL/min    Comment: (NOTE) Calculated using the CKD-EPI Creatinine Equation (2021)    Anion gap 16 (H) 5 - 15    Comment: Performed at  [facility], [address] [place], [place] [address]  Magnesium      Status: Abnormal   Collection Time: 01/07/25 10:00 PM  Result Value Ref Range   Magnesium  1.3 (L) 1.7 - 2.4 mg/dL    Comment: Performed at [facility], [address] [place], [place] [address]  Salicylate level     Status: Abnormal   Collection Time: 01/07/25 10:00 PM  Result Value Ref Range   Salicylate Lvl <7.0 (L) 7.0 - 30.0 mg/dL    Comment: Performed at [facility], [address] [place], [place] [address]  Acetaminophen  level     Status: Abnormal   Collection Time: 01/08/25 12:15 AM  Result Value Ref Range   Acetaminophen  (Tylenol ), Serum <10 (L) 10 - 30 ug/mL    Comment: (NOTE) Toxic concentrations can be more effectively related to post dose interval; >200, >100, and >50 ug/mL serum concentrations correspond to toxic concentrations at 4, 8, and 12 hours post dose, respectively.  Performed at [facility], [address], [place], [place] [address]     Blood Alcohol level:  Lab Results  Component Value Date   ETH [facility]) 01/07/2025   ETH <15 12/05/2024    Metabolic Disorder Labs:  Lab Results  Component Value Date   HGBA1C 7.2 (H) 12/07/2024   MPG 159.94 12/07/2024   MPG 136.98 09/24/2023   No results found for: PROLACTIN No results found for: CHOL, TRIG, HDL, CHOLHDL, VLDL, LDLCALC  Current Medications: Current Facility-Administered Medications  Medication Dose Route Frequency Provider Last Rate Last Admin   acetaminophen  (TYLENOL ) tablet 650 mg  650 mg Oral Q6H PRN Smith, Annie B, NP       alum & mag hydroxide-simeth (MAALOX/MYLANTA) 200-200-20 MG/5ML suspension 30 mL  30 mL Oral Q4H PRN Smith, Annie B, NP       aspirin  EC tablet 81 mg  81 mg Oral Daily Smith, Annie B, NP   81 mg at 01/09/25 9063   chlordiazePOXIDE  (LIBRIUM ) capsule 25 mg  25 mg Oral Q6H PRN Levana Minetti, MD       chlordiazePOXIDE  (LIBRIUM ) capsule 25 mg  25  mg Oral QID Lynsie Mcwatters, MD   25 mg at 01/09/25 1734   Followed by   NOREEN ON 01/10/2025] chlordiazePOXIDE  (LIBRIUM ) capsule 25 mg  25 mg Oral TID Shalana Jardin, MD  Followed by   NOREEN ON 01/11/2025] chlordiazePOXIDE  (LIBRIUM ) capsule 25 mg  25 mg Oral BH-qamhs Besan Ketchem, MD       Followed by   NOREEN ON 01/13/2025] chlordiazePOXIDE  (LIBRIUM ) capsule 25 mg  25 mg Oral Daily Wasim Hurlbut, MD       hydrALAZINE  (APRESOLINE ) tablet 25 mg  25 mg Oral Q8H PRN Zhamir Pirro, MD   25 mg at 01/08/25 1724   hydrOXYzine  (ATARAX ) tablet 25 mg  25 mg Oral Q6H PRN Emmanuel Ercole, MD   25 mg at 01/08/25 1630   hydrOXYzine  (ATARAX ) tablet 25 mg  25 mg Oral Q6H PRN Ebbie Sorenson, MD       loperamide  (IMODIUM ) capsule 2-4 mg  2-4 mg Oral PRN Kaydince Towles, MD       magnesium  hydroxide (MILK OF MAGNESIA) suspension 30 mL  30 mL Oral Daily PRN Smith, Annie B, NP       multivitamin with minerals tablet 1 tablet  1 tablet Oral Daily Evoleth Nordmeyer, MD   1 tablet at 01/09/25 1733   OLANZapine  (ZYPREXA ) injection 5 mg  5 mg Intramuscular TID PRN Smith, Annie B, NP       OLANZapine  zydis (ZYPREXA ) disintegrating tablet 5 mg  5 mg Oral TID PRN Smith, Annie B, NP       ondansetron  (ZOFRAN -ODT) disintegrating tablet 4 mg  4 mg Oral Q6H PRN Maxx Pham, MD       traZODone  (DESYREL ) tablet 50 mg  50 mg Oral QHS PRN Smith, Annie B, NP       venlafaxine  XR (EFFEXOR -XR) 24 hr capsule 37.5 mg  37.5 mg Oral Q breakfast Shundra Wirsing, MD   37.5 mg at 01/09/25 1733   PTA Medications: Medications Prior to Admission  Medication Sig Dispense Refill Last Dose/Taking   aspirin  EC 81 MG tablet Take 1 tablet (81 mg total) by mouth daily.      lisinopril -hydrochlorothiazide  (ZESTORETIC ) 20-12.5 MG tablet Take 2 tablets by mouth daily.      metFORMIN  (GLUCOPHAGE -XR) 500 MG 24 hr tablet Take 1,000 mg by mouth daily. (Patient not taking: Reported on 12/05/2024)       Psychiatric Specialty  Exam:  Presentation  General Appearance: Appropriate for Environment  Eye Contact:Fair  Speech:Clear and Coherent  Speech Volume:Decreased    Mood and Affect  Mood:Dysphoric; Irritable  Affect:Depressed; Flat   Thought Process  Thought Processes:Coherent  Descriptions of Associations:Intact  Orientation:Full (Time, Place and Person)  Thought Content:Illogical  Hallucinations:Hallucinations: None  Ideas of Reference:None  Suicidal Thoughts:Suicidal Thoughts: Yes, Passive  Homicidal Thoughts:Homicidal Thoughts: No   Sensorium  Memory:Immediate Fair; Remote Fair  Judgment:Impaired  Insight:Shallow   Executive Functions  Concentration:Fair  Attention Span:Fair  Recall:Fair  Fund of Knowledge:Fair  Language:Fair   Psychomotor Activity  Psychomotor Activity:Psychomotor Activity: Normal   Assets  Assets:Communication Skills; Resilience    Musculoskeletal: Strength & Muscle Tone: within normal limits Gait & Station: normal  Physical Exam: Physical Exam Vitals and nursing note reviewed.  HENT:     Head: Normocephalic.     Nose: Nose normal.     Mouth/Throat:     Mouth: Mucous membranes are moist.  Cardiovascular:     Rate and Rhythm: Normal rate.  Pulmonary:     Effort: Pulmonary effort is normal.  Abdominal:     General: Abdomen is flat.  Neurological:     Mental Status: She is alert.    Review of Systems  Constitutional: Negative.   HENT: Negative.  Eyes: Negative.   Cardiovascular: Negative.   Skin: Negative.    Blood pressure (!) 159/91, pulse 86, temperature 98 F (36.7 C), temperature source Oral, resp. rate 18, height 5' 4 (1.626 m), weight 78 kg, SpO2 100%. Body mass index is 29.52 kg/m.  Principal Diagnosis: MDD (major depressive disorder) Diagnosis:  Principal Problem:   MDD (major depressive disorder)   Clinical Decision Making:  Treatment Plan Summary:  Safety and Monitoring:             -- Voluntary  admission to inpatient psychiatric unit for safety, stabilization and treatment             -- Daily contact with patient to assess and evaluate symptoms and progress in treatment             -- Patient's case to be discussed in multi-disciplinary team meeting             -- Observation Level: q15 minute checks             -- Vital signs:  q12 hours             -- Precautions: suicide, elopement, and assault   2. Psychiatric Diagnoses and Treatment:                effexor  xr 37.5mg  daily CIWA with librium  taper   -- The risks/benefits/side-effects/alternatives to this medication were discussed in detail with the patient and time was given for questions. The patient consents to medication trial.                -- Metabolic profile and EKG monitoring obtained while on an atypical antipsychotic (BMI: Lipid Panel: HbgA1c: QTc:)              -- Encouraged patient to participate in unit milieu and in scheduled group therapies                            3. Medical Issues Being Addressed:      4. Discharge Planning:              -- Social work and case management to assist with discharge planning and identification of hospital follow-up needs prior to discharge             -- Estimated LOS: 5-7 days             -- Discharge Concerns: Need to establish a safety plan; Medication compliance and effectiveness             -- Discharge Goals: Return home with outpatient referrals follow ups  Physician Treatment Plan for Primary Diagnosis: MDD (major depressive disorder) Long Term Goal(s): Improvement in symptoms so as ready for discharge  Short Term Goals: Ability to identify changes in lifestyle to reduce recurrence of condition will improve  Physician Treatment Plan for Secondary Diagnosis: Principal Problem:   MDD (major depressive disorder)  Long Term Goal(s): Improvement in symptoms so as ready for discharge  Short Term Goals: Ability to identify changes in lifestyle to reduce recurrence  of condition will improve  I certify that inpatient services furnished can reasonably be expected to improve the patient's condition.    Makaley Storts, MD 1/14/20269:51 PM       [1] No Known Allergies  "

## 2025-01-09 NOTE — Plan of Care (Signed)
" °  Problem: Education: Goal: Knowledge of [facility] Education information/materials will improve Outcome: Progressing Goal: Emotional status will improve Outcome: Progressing Goal: Mental status will improve Outcome: Progressing Goal: Verbalization of understanding the information provided will improve Outcome: Progressing   Problem: Activity: Goal: Interest or engagement in activities will improve Outcome: Progressing   Problem: Activity: Goal: Interest or engagement in activities will improve Outcome: Progressing Goal: Sleeping patterns will improve Outcome: Progressing   Problem: Coping: Goal: Ability to verbalize frustrations and anger appropriately will improve Outcome: Progressing Goal: Ability to demonstrate self-control will improve Outcome: Progressing   "

## 2025-01-10 DIAGNOSIS — F332 Major depressive disorder, recurrent severe without psychotic features: Secondary | ICD-10-CM

## 2025-01-10 MED ORDER — VENLAFAXINE HCL ER 75 MG PO CP24
75.0000 mg | ORAL_CAPSULE | Freq: Every day | ORAL | Status: DC
  Administered 2025-01-11 – 2025-01-13 (×3): 75 mg via ORAL
  Filled 2025-01-10 (×3): qty 1

## 2025-01-10 NOTE — Group Note (Signed)
 Date:  01/10/2025 Time:  10:13 AM  Group Topic/Focus:  Building Self Esteem:   The Focus of this group is helping patients become aware of the effects of self-esteem on their lives, the things they and others do that enhance or undermine their self-esteem, seeing the relationship between their level of self-esteem and the choices they make and learning ways to enhance self-esteem.    Participation Level:  Did Not Attend   Christine Schaefer 01/10/2025, 10:13 AM

## 2025-01-10 NOTE — Group Note (Signed)
 Recreation Therapy Group Note   Group Topic:Goal Setting  Group Date: 01/10/2025 Start Time: 1000 End Time: 1110 Facilitators: Celestia Jeoffrey BRAVO, LRT, CTRS Location: Craft Room  Group Description: Vision Boards. Patients were given many different magazines, a glue stick, markers, and a piece of cardstock paper. LRT and pts discussed the importance of having goals in life. LRT and pts discussed the difference between short-term and long-term goals, as well as what a SMART goal is. LRT encouraged pts to create a vision board, with images they picked and then cut out with safety scissors from the magazine, for themselves, that capture their short and long-term goals. LRT encouraged pts to show and explain their vision board to the group.   Goal Area(s) Addressed:  Patient will gain knowledge of short vs. long term goals.  Patient will identify goals for themselves. Patient will practice setting SMART goals. Patient will verbalize their goals to LRT and peers.   Affect/Mood: N/A   Participation Level: Did not attend    Clinical Observations/Individualized Feedback: Patient did not attend.  Plan: Continue to engage patient in RT group sessions 2-3x/week.   Jeoffrey BRAVO Celestia, LRT, CTRS 01/10/2025 11:39 AM

## 2025-01-10 NOTE — Group Note (Signed)
 Date:  01/10/2025 Time:  8:59 PM  Group Topic/Focus:  Emotional Education:   The focus of this group is to discuss what feelings/emotions are, and how they are experienced.    Participation Level:  Active  Participation Quality:  Appropriate  Affect:  Appropriate  Cognitive:  Appropriate  Insight: Appropriate  Engagement in Group:  Engaged  Modes of Intervention:  Discussion and Education  Additional Comments:    Hason Ofarrell L 01/10/2025, 8:59 PM

## 2025-01-10 NOTE — Group Note (Signed)
 [facility] LCSW Group Therapy Note   Group Date: 01/10/2025 Start Time: 1300 End Time: 1400   Type of Therapy/Topic:  Group Therapy:  Balance in Life  Participation Level:  Active   Description of Group:    This group will address the concept of balance and how it feels and looks when one is unbalanced. Patients will be encouraged to process areas in their lives that are out of balance, and identify reasons for remaining unbalanced. Facilitators will guide patients utilizing problem- solving interventions to address and correct the stressor making their life unbalanced. Understanding and applying boundaries will be explored and addressed for obtaining  and maintaining a balanced life. Patients will be encouraged to explore ways to assertively make their unbalanced needs known to significant others in their lives, using other group members and facilitator for support and feedback.  Therapeutic Goals: Patient will identify two or more emotions or situations they have that consume much of in their lives. Patient will identify signs/triggers that life has become out of balance:  Patient will identify two ways to set boundaries in order to achieve balance in their lives:  Patient will demonstrate ability to communicate their needs through discussion and/or role plays  Summary of Patient Progress: Patient was present for the entirety of the group. She was actively engaged in the discussion and her comments helped further the conversation. Pt appeared to have some insight into the topic and herself. She appeared open and receptive to feedback/comments from both her peers and the facilitator.  Therapeutic Modalities:   Cognitive Behavioral Therapy Solution-Focused Therapy Assertiveness Training   Nadara JONELLE Fam, LCSW

## 2025-01-10 NOTE — Progress Notes (Signed)
 [facility] MD Progress Note  01/10/2025 9:11 PM Christine Schaefer  MRN:  [id number]  Christine Schaefer is a 54 year old female who presented to the Emergency department on 01/07/2025 for concerns of SI. Patient reportedly drank a lot of alcohol and then took 6 of 12.5 lisinopril 's around 4 PM and came in with concerns for SI attempt. According to EMS there was no signs of trauma and that she was alert and answering all questions.   Subjective:  Chart reviewed, case discussed in multidisciplinary meeting, patient seen during rounds.   Patient remains discharge focused.  She reports feeling better today since the Librium  taper restarted.  Patient reports his anxiety is coming under control.  She minimizes her alcohol use.  She reports that her husband mentioned that probably her job was confirmed and she needs access to her cell phone to respond to the job offer.  Provider informed the patient to let her nurse know to give her access to the cell phone for few minutes under supervision.  Patient wants to go home as soon as possible minimizing her depression and her suicide attempt.  She denies auditory/visual hallucinations.  She reports tolerating the medications fairly well.   Past Psychiatric History: see h&P Family History:  Family History  Problem Relation Age of Onset   Breast cancer Maternal Aunt    Breast cancer Paternal Aunt    Social History:  Social History   Substance and Sexual Activity  Alcohol Use Yes   Comment: 3 or 4 cans two or three times a week.     Social History   Substance and Sexual Activity  Drug Use Never    Social History   Socioeconomic History   Marital status: Married    Spouse name: Not on file   Number of children: Not on file   Years of education: Not on file   Highest education level: Not on file  Occupational History   Not on file  Tobacco Use   Smoking status: Never   Smokeless tobacco: Never  Substance and Sexual Activity   Alcohol use: Yes     Comment: 3 or 4 cans two or three times a week.   Drug use: Never   Sexual activity: Not on file  Other Topics Concern   Not on file  Social History Narrative   Not on file   Social Drivers of Health   Tobacco Use: Low Risk (01/08/2025)   Patient History    Smoking Tobacco Use: Never    Smokeless Tobacco Use: Never    Passive Exposure: Not on file  Recent Concern: Tobacco Use - Medium Risk (12/04/2024)   Received from [facility]   Patient History    Smoking Tobacco Use: Former    Smokeless Tobacco Use: Never    Passive Exposure: Not on file  Financial Resource Strain: Medium Risk (09/29/2023)   Received from [facility]   Overall Financial Resource Strain (CARDIA)    Difficulty of Paying Living Expenses: Somewhat hard  Food Insecurity: No Food Insecurity (01/08/2025)   Epic    Worried About Running Out of Food in the Last Year: Never true    Ran Out of Food in the Last Year: Never true  Transportation Needs: No Transportation Needs (01/08/2025)   Epic    Lack of Transportation (Medical): No    Lack of Transportation (Non-Medical): No  Physical Activity: Not on file  Stress: Not on file  Social Connections: Not  on file  Depression (PHQ2-9): Not on file  Alcohol Screen: Low Risk (01/08/2025)   Alcohol Screen    Last Alcohol Screening Score (AUDIT): 6  Housing: Low Risk (01/08/2025)   Epic    Unable to Pay for Housing in the Last Year: No    Number of Times Moved in the Last Year: 0    Homeless in the Last Year: No  Utilities: Not At Risk (01/08/2025)   Epic    Threatened with loss of utilities: No  Health Literacy: Not on file   Past Medical History:  Past Medical History:  Diagnosis Date   Hypertension     Past Surgical History:  Procedure Laterality Date   EXPLORATORY LAPAROTOMY      Current Medications: Current Facility-Administered Medications  Medication Dose Route Frequency Provider Last Rate Last Admin   acetaminophen   (TYLENOL ) tablet 650 mg  650 mg Oral Q6H PRN Smith, Annie B, NP       alum & mag hydroxide-simeth (MAALOX/MYLANTA) 200-200-20 MG/5ML suspension 30 mL  30 mL Oral Q4H PRN Smith, Annie B, NP       aspirin  EC tablet 81 mg  81 mg Oral Daily Smith, Annie B, NP   81 mg at 01/10/25 9097   chlordiazePOXIDE  (LIBRIUM ) capsule 25 mg  25 mg Oral Q6H PRN Denetra Formoso, MD       chlordiazePOXIDE  (LIBRIUM ) capsule 25 mg  25 mg Oral TID Edlyn Rosenburg, MD   25 mg at 01/10/25 1659   Followed by   NOREEN ON 01/11/2025] chlordiazePOXIDE  (LIBRIUM ) capsule 25 mg  25 mg Oral Letha Donnelly Mellow, MD       Followed by   NOREEN ON 01/13/2025] chlordiazePOXIDE  (LIBRIUM ) capsule 25 mg  25 mg Oral Daily Jozlin Bently, MD       hydrALAZINE  (APRESOLINE ) tablet 25 mg  25 mg Oral Q8H PRN Nahiara Kretzschmar, MD   25 mg at 01/10/25 1700   hydrOXYzine  (ATARAX ) tablet 25 mg  25 mg Oral Q6H PRN Taraji Mungo, MD   25 mg at 01/08/25 1630   hydrOXYzine  (ATARAX ) tablet 25 mg  25 mg Oral Q6H PRN Donnelly Mellow, MD       loperamide  (IMODIUM ) capsule 2-4 mg  2-4 mg Oral PRN Roshni Burbano, MD       magnesium  hydroxide (MILK OF MAGNESIA) suspension 30 mL  30 mL Oral Daily PRN Smith, Annie B, NP       multivitamin with minerals tablet 1 tablet  1 tablet Oral Daily Sabah Zucco, MD   1 tablet at 01/10/25 9097   OLANZapine  (ZYPREXA ) injection 5 mg  5 mg Intramuscular TID PRN Smith, Annie B, NP       OLANZapine  zydis (ZYPREXA ) disintegrating tablet 5 mg  5 mg Oral TID PRN Smith, Annie B, NP       ondansetron  (ZOFRAN -ODT) disintegrating tablet 4 mg  4 mg Oral Q6H PRN Lahna Nath, MD       traZODone  (DESYREL ) tablet 50 mg  50 mg Oral QHS PRN Smith, Annie B, NP   50 mg at 01/10/25 2106   [START ON 01/11/2025] venlafaxine  XR (EFFEXOR -XR) 24 hr capsule 75 mg  75 mg Oral Q breakfast Bryley Kovacevic, MD        Lab Results: No results found for this or any previous visit (from the past 48 hours).  Blood Alcohol level:  Lab Results   Component Value Date   ETH [facility]) 01/07/2025   ETH <15 12/05/2024  Metabolic Disorder Labs: Lab Results  Component Value Date   HGBA1C 7.2 (H) 12/07/2024   MPG 159.94 12/07/2024   MPG 136.98 09/24/2023   No results found for: PROLACTIN No results found for: CHOL, TRIG, HDL, CHOLHDL, VLDL, LDLCALC  Physical Findings: AIMS:  , ,  ,  ,    CIWA:  CIWA-Ar Total: 0 COWS:      Psychiatric Specialty Exam:  Presentation  General Appearance:  Appropriate for Environment  Eye Contact: Fair  Speech: Clear and Coherent  Speech Volume: Decreased    Mood and Affect  Mood: Dysphoric; Irritable  Affect: Depressed; Flat   Thought Process  Thought Processes: Coherent  Orientation:Full (Time, Place and Person)  Thought Content:Illogical  Hallucinations:Hallucinations: None  Ideas of Reference:None  Suicidal Thoughts:Suicidal Thoughts: Yes, Passive  Homicidal Thoughts:Homicidal Thoughts: No   Sensorium  Memory: Immediate Fair; Remote Fair  Judgment: Impaired  Insight: Shallow   Executive Functions  Concentration: Fair  Attention Span: Fair  Recall: Fair  Fund of Knowledge: Fair  Language: Fair   Psychomotor Activity  Psychomotor Activity: Psychomotor Activity: Normal  Musculoskeletal: Strength & Muscle Tone: within normal limits Gait & Station: normal Assets  Assets: Manufacturing Systems Engineer; Resilience    Physical Exam: Physical Exam ROS Blood pressure 138/86, pulse 92, temperature 98 F (36.7 C), temperature source Oral, resp. rate 18, height 5' 4 (1.626 m), weight 78 kg, SpO2 99%. Body mass index is 29.52 kg/m.  Diagnosis: Principal Problem:   MDD (major depressive disorder), recurrent severe, without psychosis (HCC)    Treatment Plan Summary:  Safety and Monitoring:             -- Involuntary admission to inpatient psychiatric unit for safety, stabilization and treatment             -- Daily  contact with patient to assess and evaluate symptoms and progress in treatment             -- Patient's case to be discussed in multi-disciplinary team meeting             -- Observation Level: q15 minute checks             -- Vital signs:  q12 hours             -- Precautions: suicide, elopement, and assault   2. Psychiatric Diagnoses and Treatment:                effexor  xr 37.5mg  daily to 75 mg daily to optimize anxiety CIWA with librium  taper   -- The risks/benefits/side-effects/alternatives to this medication were discussed in detail with the patient and time was given for questions. The patient consents to medication trial.                -- Metabolic profile and EKG monitoring obtained while on an atypical antipsychotic (BMI: Lipid Panel: HbgA1c: QTc:)              -- Encouraged patient to participate in unit milieu and in scheduled group therapies                            3. Medical Issues Being Addressed:     4. Discharge Planning:   -- Social work and case management to assist with discharge planning and identification of hospital follow-up needs prior to discharge  -- Estimated LOS: 3-4 days  Allyn Foil, MD 01/10/2025, 9:11 PM

## 2025-01-10 NOTE — [facility] Group Notes (Signed)
 Spirituality Group   Description: Participant directed exploration of values, beliefs and meaning   **Focus on Gratitude: Invite reflection on sources of gratitude (external/internal); goal to invite internal gratitude to foster 1) reconnection with life-giving activities 2) self-compassion.   Following a brief framework of chaplains role and ground rules of group behavior, participants are invited to share concerns or questions that engage spiritual life. Emphasis placed on common themes and shared experiences and ways to make meaning and clarify living into ones values.   Theory/Process/Goal: Utilize the theoretical framework of group therapy established by Celena Kite, Relational Cultural Theory and Rogerian approaches to facilitate relational empathy and use of the here and now to foster reflection, self-awareness, and sharing.   Observations: Christine Schaefer was reserved but did actively engage in the group discussion.  [name] HERO.Div

## 2025-01-10 NOTE — [facility] Suicide Risk Assessment (Signed)
 [facility] INPATIENT:  Family/Significant Other Suicide Prevention Education  Suicide Prevention Education:  Education Completed; Christine Schaefer Christine Schaefer/husband [phone]), has been identified by the patient as the family member/significant other with whom the patient will be residing, and identified as the person(s) who will aid the patient in the event of a mental health crisis (suicidal ideations/suicide attempt).  With written consent from the patient, the family member/significant other has been provided the following suicide prevention education, prior to the and/or following the discharge of the patient.  The suicide prevention education provided includes the following: Suicide risk factors Suicide prevention and interventions National Suicide Hotline telephone number [facility] assessment telephone number [facility] Emergency Assistance [facility] and/or Residential Mobile Crisis Unit telephone number  Request made of family/significant other to: Remove weapons (e.g., guns, rifles, knives), all items previously/currently identified as safety concern.   Remove drugs/medications (over-the-counter, prescriptions, illicit drugs), all items previously/currently identified as a safety concern.  The family member/significant other verbalizes understanding of the suicide prevention education information provided.  The family member/significant other agrees to remove the items of safety concern listed above.  Husband shared that they are going through some financial issues and looking for employment. Christine Schaefer stated that she had been in the hospital and was told to stop taking her medications. He reported that pt had been drinking and stuff got into her head and took an excess of her medications. Christine Schaefer also stated that she was making comments about not wanting to be here anymore. He denied feeling that pt is danger to herself or anyone else. However, he did acknowledge that pt has a  drinking problem and when she is drinking she becomes a danger to herself. Husband denied pt having access to weapons in the home. He inquired about length of stay and CSW provided education around typical length of stay. He did inquire about pt getting her phone to check on job status which would be helpful. CSW shared that the staff would be notified and pt could be assisted with this. No other concerns expressed. Contact ended without incident.   Christine Schaefer Fam 01/10/2025, 4:22 PM

## 2025-01-10 NOTE — Plan of Care (Signed)
   Problem: Education: Goal: Knowledge of [place] [facility] Education information/materials will improve Outcome: Progressing Goal: Emotional status will improve Outcome: Progressing Goal: Mental status will improve Outcome: Progressing Goal: Verbalization of understanding the information provided will improve Outcome: Progressing

## 2025-01-10 NOTE — Group Note (Signed)
 Recreation Therapy Group Note   Group Topic:Health and Wellness  Group Date: 01/10/2025 Start Time: 1530 End Time: 1610 Facilitators: Celestia Jeoffrey BRAVO, LRT, CTRS Location: Dayroom  Group Description: Seated Exercise. LRT discussed the mental and physical benefits of exercise. LRT and group discussed how physical activity can be used as a coping skill. Pt's and LRT followed along to an exercise video on the TV screen that provided a visual representation and audio description of every exercise performed. Pt's encouraged to listen to their bodies and stop at any time if they experience feelings of discomfort or pain. Pts were encouraged to drink water and stay hydrated.   Goal Area(s) Addressed:  Patient will learn benefits of physical activity. Patient will identify exercise as a coping skill.  Patient will follow multistep directions. Patient will try a new leisure interest.    Affect/Mood: Appropriate   Participation Level: Minimal    Clinical Observations/Individualized Feedback: Christine Schaefer came late to group. Pt was present for less than half the session.   Plan: Continue to engage patient in RT group sessions 2-3x/week.   Jeoffrey BRAVO Celestia, LRT, CTRS 01/10/2025 5:16 PM

## 2025-01-11 DIAGNOSIS — F332 Major depressive disorder, recurrent severe without psychotic features: Secondary | ICD-10-CM | POA: Diagnosis not present

## 2025-01-11 LAB — LIPID PANEL
Cholesterol: 280 mg/dL — ABNORMAL HIGH (ref 0–200)
HDL: 39 mg/dL — ABNORMAL LOW
LDL Cholesterol: 192 mg/dL — ABNORMAL HIGH (ref 0–99)
Total CHOL/HDL Ratio: 7.1 ratio
Triglycerides: 241 mg/dL — ABNORMAL HIGH
VLDL: 48 mg/dL — ABNORMAL HIGH (ref 0–40)

## 2025-01-11 LAB — HEMOGLOBIN A1C
Hgb A1c MFr Bld: 7.7 % — ABNORMAL HIGH (ref 4.8–5.6)
Mean Plasma Glucose: 174.29 mg/dL

## 2025-01-11 NOTE — Group Note (Signed)
 Date:  01/11/2025 Time:  12:03 PM  Group Topic/Focus:  Goals Group:   The focus of this group is to help patients establish daily goals to achieve during treatment and discuss how the patient can incorporate goal setting into their daily lives to aide in recovery.    Participation Level:  Active  Participation Quality:  Appropriate  Affect:  Appropriate  Cognitive:  Appropriate  Insight: Appropriate  Engagement in Group:  Engaged  Modes of Intervention:  Activity  Additional Comments:     Christine Schaefer June 01/11/2025, 12:03 PM

## 2025-01-11 NOTE — Progress Notes (Signed)
 [facility] MD Progress Note  01/11/2025 9:11 PM Christine Schaefer  MRN:  [id number]  An Christine Schaefer is a 54 year old female who presented to the Emergency department on 01/07/2025 for concerns of SI. Patient reportedly drank a lot of alcohol and then took 6 of 12.5 lisinopril 's around 4 PM and came in with concerns for SI attempt. According to EMS there was no signs of trauma and that she was alert and answering all questions.   Subjective:  Chart reviewed, case discussed in multidisciplinary meeting, patient seen during rounds.   Patient reports ongoing improvement in her mood.  With the Librium  taper she denies any acute withdrawal from alcohol today.  She reports able to sleep and eat well.  She denies nausea vomiting or any shakes.  Reviewed taper is ongoing.  Patient is educated on increased dose of Effexor  to help with anxiety.  She denies SI/HI/plan.  Provider reached out to patient's husband who corroborated patient's main stressors being jobless.  He also confirmed that patient applied for many jobs and there are some emails confirming a job offer for that she needs to responsive.  Provider and patient discussed about discharge planning for Sunday so that she can attend to her new job next week.  Husband is also requesting for a week and discharge as he has transportation issues on weekdays Past Psychiatric History: see h&P Family History:  Family History  Problem Relation Age of Onset   Breast cancer Maternal Aunt    Breast cancer Paternal Aunt    Social History:  Social History   Substance and Sexual Activity  Alcohol Use Yes   Comment: 3 or 4 cans two or three times a week.     Social History   Substance and Sexual Activity  Drug Use Never    Social History   Socioeconomic History   Marital status: Married    Spouse name: Not on file   Number of children: Not on file   Years of education: Not on file   Highest education level: Not on file  Occupational History   Not on  file  Tobacco Use   Smoking status: Never   Smokeless tobacco: Never  Substance and Sexual Activity   Alcohol use: Yes    Comment: 3 or 4 cans two or three times a week.   Drug use: Never   Sexual activity: Not on file  Other Topics Concern   Not on file  Social History Narrative   Not on file   Social Drivers of Health   Tobacco Use: Low Risk (01/08/2025)   Patient History    Smoking Tobacco Use: Never    Smokeless Tobacco Use: Never    Passive Exposure: Not on file  Recent Concern: Tobacco Use - Medium Risk (12/04/2024)   Received from [facility]   Patient History    Smoking Tobacco Use: Former    Smokeless Tobacco Use: Never    Passive Exposure: Not on file  Financial Resource Strain: Medium Risk (09/29/2023)   Received from [facility]   Overall Financial Resource Strain (CARDIA)    Difficulty of Paying Living Expenses: Somewhat hard  Food Insecurity: No Food Insecurity (01/08/2025)   Epic    Worried About Running Out of Food in the Last Year: Never true    Ran Out of Food in the Last Year: Never true  Transportation Needs: No Transportation Needs (01/08/2025)   Epic    Lack of Transportation (Medical):  No    Lack of Transportation (Non-Medical): No  Physical Activity: Not on file  Stress: Not on file  Social Connections: Not on file  Depression (EYV7-0): Not on file  Alcohol Screen: Low Risk (01/08/2025)   Alcohol Screen    Last Alcohol Screening Score (AUDIT): 6  Housing: Low Risk (01/08/2025)   Epic    Unable to Pay for Housing in the Last Year: No    Number of Times Moved in the Last Year: 0    Homeless in the Last Year: No  Utilities: Not At Risk (01/08/2025)   Epic    Threatened with loss of utilities: No  Health Literacy: Not on file   Past Medical History:  Past Medical History:  Diagnosis Date   Hypertension     Past Surgical History:  Procedure Laterality Date   EXPLORATORY LAPAROTOMY      Current  Medications: Current Facility-Administered Medications  Medication Dose Route Frequency Provider Last Rate Last Admin   acetaminophen  (TYLENOL ) tablet 650 mg  650 mg Oral Q6H PRN Smith, Annie B, NP       alum & mag hydroxide-simeth (MAALOX/MYLANTA) 200-200-20 MG/5ML suspension 30 mL  30 mL Oral Q4H PRN Smith, Annie B, NP       aspirin  EC tablet 81 mg  81 mg Oral Daily Smith, Annie B, NP   81 mg at 01/11/25 9157   chlordiazePOXIDE  (LIBRIUM ) capsule 25 mg  25 mg Oral Q6H PRN Shalika Arntz, MD       chlordiazePOXIDE  (LIBRIUM ) capsule 25 mg  25 mg Oral BH-qamhs Genita Nilsson, MD       Followed by   NOREEN ON 01/13/2025] chlordiazePOXIDE  (LIBRIUM ) capsule 25 mg  25 mg Oral Daily Gurshan Settlemire, MD       hydrALAZINE  (APRESOLINE ) tablet 25 mg  25 mg Oral Q8H PRN Lolah Coghlan, MD   25 mg at 01/11/25 1830   hydrOXYzine  (ATARAX ) tablet 25 mg  25 mg Oral Q6H PRN Latroya Ng, MD   25 mg at 01/08/25 1630   hydrOXYzine  (ATARAX ) tablet 25 mg  25 mg Oral Q6H PRN Danise Dehne, MD       loperamide  (IMODIUM ) capsule 2-4 mg  2-4 mg Oral PRN Donnelly Mellow, MD       magnesium  hydroxide (MILK OF MAGNESIA) suspension 30 mL  30 mL Oral Daily PRN Smith, Annie B, NP       multivitamin with minerals tablet 1 tablet  1 tablet Oral Daily Jaevin Medearis, MD   1 tablet at 01/11/25 9157   OLANZapine  (ZYPREXA ) injection 5 mg  5 mg Intramuscular TID PRN Smith, Annie B, NP       OLANZapine  zydis (ZYPREXA ) disintegrating tablet 5 mg  5 mg Oral TID PRN Smith, Annie B, NP       ondansetron  (ZOFRAN -ODT) disintegrating tablet 4 mg  4 mg Oral Q6H PRN Laiyah Exline, MD       traZODone  (DESYREL ) tablet 50 mg  50 mg Oral QHS PRN Smith, Annie B, NP   50 mg at 01/10/25 2106   venlafaxine  XR (EFFEXOR -XR) 24 hr capsule 75 mg  75 mg Oral Q breakfast Vilma Will, MD   75 mg at 01/11/25 9157    Lab Results:  Results for orders placed or performed during the hospital encounter of 01/08/25 (from the past 48 hours)   Hemoglobin A1c     Status: Abnormal   Collection Time: 01/11/25  9:03 AM  Result Value Ref Range   Hgb A1c  MFr Bld 7.7 (H) 4.8 - 5.6 %    Comment: (NOTE) Diagnosis of Diabetes The following HbA1c ranges recommended by the American Diabetes Association (ADA) may be used as an aid in the diagnosis of diabetes mellitus.  Hemoglobin             Suggested A1C NGSP%              Diagnosis  <5.7                   Non Diabetic  5.7-6.4                Pre-Diabetic  >6.4                   Diabetic  <7.0                   Glycemic control for                       adults with diabetes.     Mean Plasma Glucose 174.29 mg/dL    Comment: Performed at [facility], [address] [place], [place] [address]  Lipid panel     Status: Abnormal   Collection Time: 01/11/25  9:03 AM  Result Value Ref Range   Cholesterol 280 (H) 0 - 200 mg/dL    Comment:        ATP III CLASSIFICATION:  <200     mg/dL   Desirable  799-760  mg/dL   Borderline High  >=759    mg/dL   High           Triglycerides 241 (H) <150 mg/dL   HDL 39 (L) >59 mg/dL   Total CHOL/HDL Ratio 7.1 RATIO   VLDL 48 (H) 0 - 40 mg/dL   LDL Cholesterol 807 (H) 0 - 99 mg/dL    Comment:        Total Cholesterol/HDL:CHD Risk Coronary Heart Disease Risk Table                     Men   Women  1/2 Average Risk   3.4   3.3  Average Risk       5.0   4.4  2 X Average Risk   9.6   7.1  3 X Average Risk  23.4   11.0        Use the calculated Patient Ratio above and the CHD Risk Table to determine the patient's CHD Risk.        ATP III CLASSIFICATION (LDL):  <100     mg/dL   Optimal  899-870  mg/dL   Near or Above                    Optimal  130-159  mg/dL   Borderline  839-810  mg/dL   High  >809     mg/dL   Very High Performed at [facility], [address] [place], [place] [address]     Blood Alcohol level:  Lab Results  Component Value Date   ETH [facility]) 01/07/2025   ETH <15 12/05/2024    Metabolic  Disorder Labs: Lab Results  Component Value Date   HGBA1C 7.7 (H) 01/11/2025   MPG 174.29 01/11/2025   MPG 159.94 12/07/2024   No results found for: PROLACTIN Lab Results  Component Value Date   CHOL 280 (H) 01/11/2025   TRIG 241 (  H) 01/11/2025   HDL 39 (L) 01/11/2025   CHOLHDL 7.1 01/11/2025   VLDL 48 (H) 01/11/2025   LDLCALC 192 (H) 01/11/2025    Physical Findings: AIMS:  , ,  ,  ,    CIWA:  CIWA-Ar Total: 2 COWS:      Psychiatric Specialty Exam:  Presentation  General Appearance:  Appropriate for Environment  Eye Contact: Fair  Speech: Clear and Coherent  Speech Volume: Decreased    Mood and Affect  Mood: Fine Affect: Stable  Thought Process  Thought Processes: Coherent  Orientation:Full (Time, Place and Person)  Thought Content: Logical Hallucinations: Denies  Ideas of Reference:None  Suicidal Thoughts: Denies  Homicidal Thoughts:Denies   Sensorium  Memory: Immediate Fair; Remote Fair  Judgment: Impaired  Insight: Shallow   Executive Functions  Concentration: Fair  Attention Span: Fair  Recall: [employer] of Knowledge: Fair  Language: Fair   Psychomotor Activity  Psychomotor Activity: No data recorded  Musculoskeletal: Strength & Muscle Tone: within normal limits Gait & Station: normal Assets  Assets: Manufacturing Systems Engineer; Resilience    Physical Exam: Physical Exam ROS Blood pressure (!) 158/94, pulse 93, temperature 98 F (36.7 C), temperature source Oral, resp. rate 18, height 5' 4 (1.626 m), weight 78 kg, SpO2 99%. Body mass index is 29.52 kg/m.  Diagnosis: Principal Problem:   MDD (major depressive disorder), recurrent severe, without psychosis (HCC)    Treatment Plan Summary:  Safety and Monitoring:             -- Involuntary admission to inpatient psychiatric unit for safety, stabilization and treatment             -- Daily contact with patient to assess and evaluate symptoms and  progress in treatment             -- Patient's case to be discussed in multi-disciplinary team meeting             -- Observation Level: q15 minute checks             -- Vital signs:  q12 hours             -- Precautions: suicide, elopement, and assault   2. Psychiatric Diagnoses and Treatment:                effexor  xr 37.5mg  daily to 75 mg daily to optimize anxiety CIWA with librium  taper   -- The risks/benefits/side-effects/alternatives to this medication were discussed in detail with the patient and time was given for questions. The patient consents to medication trial.                -- Metabolic profile and EKG monitoring obtained while on an atypical antipsychotic (BMI: Lipid Panel: HbgA1c: QTc:)              -- Encouraged patient to participate in unit milieu and in scheduled group therapies                            3. Medical Issues Being Addressed:     4. Discharge Planning:   -- Social work and case management to assist with discharge planning and identification of hospital follow-up needs prior to discharge  -- Estimated LOS: 3-4 days  Allyn Foil, MD 01/11/2025, 9:11 PM

## 2025-01-11 NOTE — Group Note (Signed)
 Recreation Therapy Group Note   Group Topic:Stress Management  Group Date: 01/11/2025 Start Time: 1530 End Time: 1610 Facilitators: Celestia Jeoffrey BRAVO, LRT, CTRS Location: Dayroom  Group Description: Taboo. LRT and patients played the game Taboo. The object of the game is to have peers guess the word up at the top of the card drawn that is in bold, while being sure to not use any of the descriptive words down below it on the same card. If the person attempting to explain the word in bold uses any of the descriptive words down below, they lose their turn, and no one receives that card or point. LRT and patient's took turns being the one to describe the words while the rest of the group tried to guess what they were describing.   Goal Area(s) Addressed: Patient will identify physical symptoms of stress. Patient will identify emotional symptoms of stress. Patient will identify coping skills for stress. Patient will build frustration tolerance skills.  Patient will increase communication.   Affect/Mood: N/A   Participation Level: Did not attend    Clinical Observations/Individualized Feedback: Patient did not attend.  Plan: Continue to engage patient in RT group sessions 2-3x/week.   Jeoffrey BRAVO Celestia, LRT, CTRS 01/11/2025 4:57 PM

## 2025-01-11 NOTE — Plan of Care (Signed)
   Problem: Education: Goal: Knowledge of Christine Schaefer General Education information/materials will improve Outcome: Progressing Goal: Emotional status will improve Outcome: Progressing Goal: Mental status will improve Outcome: Progressing Goal: Verbalization of understanding the information provided will improve Outcome: Progressing   Problem: Activity: Goal: Interest or engagement in activities will improve Outcome: Progressing Goal: Sleeping patterns will improve Outcome: Progressing   Problem: Coping: Goal: Ability to verbalize frustrations and anger appropriately will improve Outcome: Progressing Goal: Ability to demonstrate self-control will improve Outcome: Progressing

## 2025-01-11 NOTE — Plan of Care (Signed)
   Problem: Education: Goal: Mental status will improve Outcome: Progressing Goal: Verbalization of understanding the information provided will improve Outcome: Progressing   Problem: Activity: Goal: Interest or engagement in activities will improve Outcome: Progressing

## 2025-01-11 NOTE — Progress Notes (Signed)
" °   01/10/25 2300  Psych Admission Type (Psych Patients Only)  Admission Status Involuntary  Psychosocial Assessment  Patient Complaints None  Eye Contact Fair  Facial Expression Flat;Sad  Affect Appropriate to circumstance  Speech Logical/coherent  Interaction Assertive  Motor Activity Slow  Appearance/Hygiene Unremarkable  Behavior Characteristics Cooperative  Mood Anxious;Depressed  Aggressive Behavior  Effect No apparent injury  Thought Process  Coherency WDL  Content WDL  Delusions None reported or observed  Perception WDL  Hallucination None reported or observed  Judgment Impaired  Confusion None  Danger to Self  Current suicidal ideation? Denies  Agreement Not to Harm Self Yes  Description of Agreement Verbal  Danger to Others  Danger to Others None reported or observed    "

## 2025-01-11 NOTE — Group Note (Signed)
 Date:  01/11/2025 Time:  10:52 PM  Group Topic/Focus:  Healthy Communication:   The focus of this group is to discuss communication, barriers to communication, as well as healthy ways to communicate with others.    Pt did not attend group.  Marilea Gwynne L 01/11/2025, 10:52 PM

## 2025-01-11 NOTE — Progress Notes (Signed)
 Patient cell phone charged Patient Husband did not come this visit to pick up the phone. Patient provided with her cellphone to check her email with Staff supervision.

## 2025-01-11 NOTE — Group Note (Signed)
 Recreation Therapy Group Note   Group Topic:Leisure Education  Group Date: 01/11/2025 Start Time: 1030 End Time: 1140 Facilitators: Celestia Jeoffrey BRAVO, LRT, CTRS Location: Craft Room  Group Description: Leisure. Patients were given the option to choose from journaling, coloring, drawing, making origami, playing with playdoh, listening to music or singing karaoke. LRT and pts discussed the meaning of leisure, the importance of participating in leisure during their free time/when they're outside of the hospital, as well as how our leisure interests can also serve as coping skills.   Goal Area(s) Addressed:  Patient will identify a current leisure interest.  Patient will learn the definition of leisure. Patient will practice making a positive decision. Patient will have the opportunity to try a new leisure activity. Patient will communicate with peers and LRT.    Affect/Mood: Flat   Participation Level: Moderate   Participation Quality: Independent   Behavior: Calm and Cooperative   Speech/Thought Process: Coherent   Insight: Fair   Judgement: Fair    Modes of Intervention: Clarification, Education, Exploration, and Music   Patient Response to Interventions:  Receptive   Education Outcome:  In group clarification offered    Clinical Observations/Individualized Feedback: Christine Schaefer was somewhat active in their participation of session activities and group discussion. Pt identified antique shopping and crafting as things she does in her free time.    Plan: Continue to engage patient in RT group sessions 2-3x/week.   Jeoffrey BRAVO Celestia, LRT, CTRS 01/11/2025 12:06 PM

## 2025-01-12 NOTE — Group Note (Signed)
 Date:  01/12/2025 Time:  10:10 PM  Group Topic/Focus:  Managing Feelings:   The focus of this group is to identify what feelings patients have difficulty handling and develop a plan to handle them in a healthier way upon discharge. Self Care:   The focus of this group is to help patients understand the importance of self-care in order to improve or restore emotional, physical, spiritual, interpersonal, and financial health. Wrap-Up Group:   The focus of this group is to help patients review their daily goal of treatment and discuss progress on daily workbooks.    Participation Level:  Active  Participation Quality:  Appropriate and Attentive  Affect:  Appropriate  Cognitive:  Alert, Appropriate, and Oriented  Insight: Appropriate and Good  Engagement in Group:  Engaged  Modes of Intervention:  Discussion and Support  Additional Comments:  N/A  Christine Schaefer 01/12/2025, 10:10 PM

## 2025-01-12 NOTE — Plan of Care (Signed)
 Christine Schaefer is a 54 y.o. female patient. No diagnosis found. Past Medical History:  Diagnosis Date   Hypertension    Current Facility-Administered Medications  Medication Dose Route Frequency Provider Last Rate Last Admin   acetaminophen  (TYLENOL ) tablet 650 mg  650 mg Oral Q6H PRN Smith, Annie B, NP   650 mg at 01/12/25 0435   alum & mag hydroxide-simeth (MAALOX/MYLANTA) 200-200-20 MG/5ML suspension 30 mL  30 mL Oral Q4H PRN Smith, Annie B, NP       aspirin  EC tablet 81 mg  81 mg Oral Daily Smith, Annie B, NP   81 mg at 01/11/25 9157   chlordiazePOXIDE  (LIBRIUM ) capsule 25 mg  25 mg Oral Q6H PRN Jadapalle, Sree, MD       chlordiazePOXIDE  (LIBRIUM ) capsule 25 mg  25 mg Oral BH-qamhs Jadapalle, Sree, MD   25 mg at 01/11/25 2119   Followed by   NOREEN ON 01/13/2025] chlordiazePOXIDE  (LIBRIUM ) capsule 25 mg  25 mg Oral Daily Jadapalle, Sree, MD       hydrALAZINE  (APRESOLINE ) tablet 25 mg  25 mg Oral Q8H PRN Jadapalle, Sree, MD   25 mg at 01/11/25 1830   hydrOXYzine  (ATARAX ) tablet 25 mg  25 mg Oral Q6H PRN Jadapalle, Sree, MD   25 mg at 01/08/25 1630   hydrOXYzine  (ATARAX ) tablet 25 mg  25 mg Oral Q6H PRN Jadapalle, Sree, MD   25 mg at 01/11/25 2118   loperamide  (IMODIUM ) capsule 2-4 mg  2-4 mg Oral PRN Jadapalle, Sree, MD       magnesium  hydroxide (MILK OF MAGNESIA) suspension 30 mL  30 mL Oral Daily PRN Smith, Annie B, NP       multivitamin with minerals tablet 1 tablet  1 tablet Oral Daily Jadapalle, Sree, MD   1 tablet at 01/11/25 9157   OLANZapine  (ZYPREXA ) injection 5 mg  5 mg Intramuscular TID PRN Smith, Annie B, NP       OLANZapine  zydis (ZYPREXA ) disintegrating tablet 5 mg  5 mg Oral TID PRN Smith, Annie B, NP       ondansetron  (ZOFRAN -ODT) disintegrating tablet 4 mg  4 mg Oral Q6H PRN Jadapalle, Sree, MD       traZODone  (DESYREL ) tablet 50 mg  50 mg Oral QHS PRN Smith, Annie B, NP   50 mg at 01/11/25 2118   venlafaxine  XR (EFFEXOR -XR) 24 hr capsule 75 mg  75 mg Oral Q breakfast  Jadapalle, Sree, MD   75 mg at 01/11/25 9157   Allergies[1] Principal Problem:   MDD (major depressive disorder), recurrent severe, without psychosis (HCC)  Blood pressure (!) 158/94, pulse 93, temperature 98 F (36.7 C), temperature source Oral, resp. rate 18, height 5' 4 (1.626 m), weight 78 kg, SpO2 99%.    Lorelie Biermann B Sary Bogie 01/12/2025      [1] No Known Allergies

## 2025-01-12 NOTE — Plan of Care (Signed)

## 2025-01-12 NOTE — Progress Notes (Signed)
" °   01/12/25 0930  Psych Admission Type (Psych Patients Only)  Admission Status Involuntary  Psychosocial Assessment  Patient Complaints None  Eye Contact Fair  Facial Expression Flat  Affect Appropriate to circumstance  Speech Logical/coherent  Interaction Assertive  Motor Activity Slow  Appearance/Hygiene Unremarkable  Behavior Characteristics Cooperative;Appropriate to situation  Mood Anxious  Aggressive Behavior  Effect No apparent injury  Thought Process  Coherency WDL  Content WDL  Delusions None reported or observed  Perception WDL  Hallucination None reported or observed  Judgment WDL  Confusion None  Danger to Self  Current suicidal ideation?  (Denies)  Agreement Not to Harm Self Yes  Description of Agreement Verbal  Danger to Others  Danger to Others None reported or observed    "

## 2025-01-12 NOTE — Progress Notes (Signed)
 Please adjust times for librium  and aspirin  01/13/25 @8am  needs to move bc was given around 1630 on 01/12/25

## 2025-01-12 NOTE — Progress Notes (Addendum)
 [facility] MD Progress Note  01/12/2025 10:49 AM Christine Schaefer  MRN:  [id number]  Christine Schaefer is a 54 year old female who presented to the Emergency department on 01/07/2025 for concerns of SI. Patient reportedly drank a lot of alcohol and then took 6 of 12.5 lisinopril 's around 4 PM and came in with concerns for SI attempt. According to EMS there was no signs of trauma and that she was alert and answering all questions.   Subjective:  Chart reviewed, case discussed in multidisciplinary meeting, patient seen during rounds.   01/12/2025: Patient was seen this morning for psychiatric reassessment during rounds. Patient is alert and oriented X 4, calm, cooperative, and engages well in the interview. Patient reports she does not feel well this morning secondary to her BP being elevated. She relates attempting to overdose on the BP medication and not taking it since admission. Patient was informed that she will receive Hydralazine  25 mg daily scheduled to assist with stabilizing her BP to prevent hypertensive episodes. Patient endorses some improvements with depressive symptoms, rates her depression a 4 out of 10 today and her anxiety a 6 out of 10. She presents with clear, linear thoughts, judgement and insight was intact. Patient denies any alcohol withdrawal symptoms today. Patient reports she is eating but has difficulty sleeping. Patient also denies current suicidal, homicidal ideation, or perceptual disturbances. Patient will be discharging tomorrow and her husband with plans for her husband to transport her home.   Patient reports ongoing improvement in her mood.  With the Librium  taper she denies any acute withdrawal from alcohol today.  She reports able to sleep and eat well.  She denies nausea vomiting or any shakes.  Reviewed taper is ongoing.  Patient is educated on increased dose of Effexor  to help with anxiety.  She denies SI/HI/plan.  Provider reached out to patient's husband who  corroborated patient's main stressors being jobless.  He also confirmed that patient applied for many jobs and there are some emails confirming a job offer for that she needs to responsive.  Provider and patient discussed about discharge planning for Sunday so that she can attend to her new job next week.  Husband is also requesting for a week and discharge as he has transportation issues on weekdays Past Psychiatric History: see h&P Family History:  Family History  Problem Relation Age of Onset   Breast cancer Maternal Aunt    Breast cancer Paternal Aunt    Social History:  Social History   Substance and Sexual Activity  Alcohol Use Yes   Comment: 3 or 4 cans two or three times a week.     Social History   Substance and Sexual Activity  Drug Use Never    Social History   Socioeconomic History   Marital status: Married    Spouse name: Not on file   Number of children: Not on file   Years of education: Not on file   Highest education level: Not on file  Occupational History   Not on file  Tobacco Use   Smoking status: Never   Smokeless tobacco: Never  Substance and Sexual Activity   Alcohol use: Yes    Comment: 3 or 4 cans two or three times a week.   Drug use: Never   Sexual activity: Not on file  Other Topics Concern   Not on file  Social History Narrative   Not on file   Social Drivers of Health   Tobacco Use: Low  Risk (01/08/2025)   Patient History    Smoking Tobacco Use: Never    Smokeless Tobacco Use: Never    Passive Exposure: Not on file  Recent Concern: Tobacco Use - Medium Risk (12/04/2024)   Received from [facility]   Patient History    Smoking Tobacco Use: Former    Smokeless Tobacco Use: Never    Passive Exposure: Not on file  Financial Resource Strain: Medium Risk (09/29/2023)   Received from [facility]   Overall Financial Resource Strain (CARDIA)    Difficulty of Paying Living Expenses: Somewhat hard  Food  Insecurity: No Food Insecurity (01/08/2025)   Epic    Worried About Running Out of Food in the Last Year: Never true    Ran Out of Food in the Last Year: Never true  Transportation Needs: No Transportation Needs (01/08/2025)   Epic    Lack of Transportation (Medical): No    Lack of Transportation (Non-Medical): No  Physical Activity: Not on file  Stress: Not on file  Social Connections: Not on file  Depression (EYV7-0): Not on file  Alcohol Screen: Low Risk (01/08/2025)   Alcohol Screen    Last Alcohol Screening Score (AUDIT): 6  Housing: Low Risk (01/08/2025)   Epic    Unable to Pay for Housing in the Last Year: No    Number of Times Moved in the Last Year: 0    Homeless in the Last Year: No  Utilities: Not At Risk (01/08/2025)   Epic    Threatened with loss of utilities: No  Health Literacy: Not on file   Past Medical History:  Past Medical History:  Diagnosis Date   Hypertension     Past Surgical History:  Procedure Laterality Date   EXPLORATORY LAPAROTOMY      Current Medications: Current Facility-Administered Medications  Medication Dose Route Frequency Provider Last Rate Last Admin   acetaminophen  (TYLENOL ) tablet 650 mg  650 mg Oral Q6H PRN Smith, Annie B, NP   650 mg at 01/12/25 0435   alum & mag hydroxide-simeth (MAALOX/MYLANTA) 200-200-20 MG/5ML suspension 30 mL  30 mL Oral Q4H PRN Smith, Annie B, NP       aspirin  EC tablet 81 mg  81 mg Oral Daily Smith, Annie B, NP   81 mg at 01/11/25 9157   chlordiazePOXIDE  (LIBRIUM ) capsule 25 mg  25 mg Oral Q6H PRN Jadapalle, Sree, MD       chlordiazePOXIDE  (LIBRIUM ) capsule 25 mg  25 mg Oral BH-qamhs Jadapalle, Allyn, MD   25 mg at 01/11/25 2119   Followed by   NOREEN ON 01/13/2025] chlordiazePOXIDE  (LIBRIUM ) capsule 25 mg  25 mg Oral Daily Jadapalle, Sree, MD       hydrALAZINE  (APRESOLINE ) tablet 25 mg  25 mg Oral Q8H PRN Jadapalle, Sree, MD   25 mg at 01/12/25 9376   hydrOXYzine  (ATARAX ) tablet 25 mg  25 mg Oral Q6H PRN  Jadapalle, Sree, MD   25 mg at 01/08/25 1630   hydrOXYzine  (ATARAX ) tablet 25 mg  25 mg Oral Q6H PRN Jadapalle, Sree, MD   25 mg at 01/11/25 2118   loperamide  (IMODIUM ) capsule 2-4 mg  2-4 mg Oral PRN Donnelly Allyn, MD       magnesium  hydroxide (MILK OF MAGNESIA) suspension 30 mL  30 mL Oral Daily PRN Smith, Annie B, NP       multivitamin with minerals tablet 1 tablet  1 tablet Oral Daily Jadapalle, Sree, MD   1 tablet  at 01/11/25 9157   OLANZapine  (ZYPREXA ) injection 5 mg  5 mg Intramuscular TID PRN Smith, Annie B, NP       OLANZapine  zydis (ZYPREXA ) disintegrating tablet 5 mg  5 mg Oral TID PRN Smith, Annie B, NP       ondansetron  (ZOFRAN -ODT) disintegrating tablet 4 mg  4 mg Oral Q6H PRN Jadapalle, Sree, MD       traZODone  (DESYREL ) tablet 50 mg  50 mg Oral QHS PRN Smith, Annie B, NP   50 mg at 01/11/25 2118   venlafaxine  XR (EFFEXOR -XR) 24 hr capsule 75 mg  75 mg Oral Q breakfast Jadapalle, Sree, MD   75 mg at 01/11/25 9157    Lab Results:  Results for orders placed or performed during the hospital encounter of 01/08/25 (from the past 48 hours)  Hemoglobin A1c     Status: Abnormal   Collection Time: 01/11/25  9:03 AM  Result Value Ref Range   Hgb A1c MFr Bld 7.7 (H) 4.8 - 5.6 %    Comment: (NOTE) Diagnosis of Diabetes The following HbA1c ranges recommended by the American Diabetes Association (ADA) may be used as an aid in the diagnosis of diabetes mellitus.  Hemoglobin             Suggested A1C NGSP%              Diagnosis  <5.7                   Non Diabetic  5.7-6.4                Pre-Diabetic  >6.4                   Diabetic  <7.0                   Glycemic control for                       adults with diabetes.     Mean Plasma Glucose 174.29 mg/dL    Comment: Performed at [facility], [address], [place], [place] [address]  Lipid panel     Status: Abnormal   Collection Time: 01/11/25  9:03 AM  Result Value Ref Range   Cholesterol 280 (H) 0 - 200 mg/dL     Comment:        ATP III CLASSIFICATION:  <200     mg/dL   Desirable  799-760  mg/dL   Borderline High  >=759    mg/dL   High           Triglycerides 241 (H) <150 mg/dL   HDL 39 (L) >59 mg/dL   Total CHOL/HDL Ratio 7.1 RATIO   VLDL 48 (H) 0 - 40 mg/dL   LDL Cholesterol 807 (H) 0 - 99 mg/dL    Comment:        Total Cholesterol/HDL:CHD Risk Coronary Heart Disease Risk Table                     Men   Women  1/2 Average Risk   3.4   3.3  Average Risk       5.0   4.4  2 X Average Risk   9.6   7.1  3 X Average Risk  23.4   11.0        Use the calculated Patient Ratio above and the CHD Risk Table to determine  the patient's CHD Risk.        ATP III CLASSIFICATION (LDL):  <100     mg/dL   Optimal  899-870  mg/dL   Near or Above                    Optimal  130-159  mg/dL   Borderline  839-810  mg/dL   High  >809     mg/dL   Very High Performed at [facility], [address] [place], [place] [address]     Blood Alcohol level:  Lab Results  Component Value Date   ETH 312 [facility]) 01/07/2025   ETH <15 12/05/2024    Metabolic Disorder Labs: Lab Results  Component Value Date   HGBA1C 7.7 (H) 01/11/2025   MPG 174.29 01/11/2025   MPG 159.94 12/07/2024   No results found for: PROLACTIN Lab Results  Component Value Date   CHOL 280 (H) 01/11/2025   TRIG 241 (H) 01/11/2025   HDL 39 (L) 01/11/2025   CHOLHDL 7.1 01/11/2025   VLDL 48 (H) 01/11/2025   LDLCALC 192 (H) 01/11/2025    Physical Findings: AIMS:  , ,  ,  ,    CIWA:  CIWA-Ar Total: 0 COWS:      Psychiatric Specialty Exam:  Presentation  General Appearance:  Appropriate for Environment  Eye Contact: Fair  Speech: Clear and Coherent  Speech Volume: Decreased    Mood and Affect  Mood: Fine Affect: Stable  Thought Process  Thought Processes: Coherent  Orientation:Full (Time, Place and Person)  Thought Content: Logical Hallucinations: Denies  Ideas of  Reference:None  Suicidal Thoughts: Denies  Homicidal Thoughts:Denies   Sensorium  Memory: Immediate Fair; Remote Fair  Judgment: Impaired  Insight: Shallow   Executive Functions  Concentration: Fair  Attention Span: Fair  Recall: [employer] of Knowledge: Fair  Language: Fair   Psychomotor Activity  Psychomotor Activity: No data recorded  Musculoskeletal: Strength & Muscle Tone: within normal limits Gait & Station: normal Assets  Assets: Manufacturing Systems Engineer; Resilience    Physical Exam: Physical Exam ROS Blood pressure (!) 158/104, pulse (!) 104, temperature 98.4 F (36.9 C), temperature source Oral, resp. rate 18, height 5' 4 (1.626 m), weight 78 kg, SpO2 98%. Body mass index is 29.52 kg/m.  Diagnosis: Principal Problem:   MDD (major depressive disorder), recurrent severe, without psychosis (HCC)    Treatment Plan Summary:  Safety and Monitoring:             -- Involuntary admission to inpatient psychiatric unit for safety, stabilization and treatment             -- Daily contact with patient to assess and evaluate symptoms and progress in treatment             -- Patient's case to be discussed in multi-disciplinary team meeting             -- Observation Level: q15 minute checks             -- Vital signs:  q12 hours             -- Precautions: suicide, elopement, and assault   2. Psychiatric Diagnoses and Treatment:               Continue Effexor  xr 37.5mg  daily to 75 mg daily to optimize anxiety CIWA with librium  taper   -- The risks/benefits/side-effects/alternatives to this medication were discussed in detail with the patient and time was  given for questions. The patient consents to medication trial.                -- Metabolic profile and EKG monitoring obtained while on an atypical antipsychotic (BMI: Lipid Panel: HbgA1c: QTc:)              -- Encouraged patient to participate in unit milieu and in scheduled group therapies                             3. Medical Issues Being Addressed:    -Initiate Hydralazine  25 mg daily for HTN.   4. Discharge Planning:   -- Social work and case management to assist with discharge planning and identification of hospital follow-up needs prior to discharge  -- Estimated LOS: 3-4 days  Camelia JINNY Mountain, NP 01/12/2025, 10:49 AM

## 2025-01-13 MED ORDER — TRAZODONE HCL 50 MG PO TABS: 50.0000 mg | ORAL_TABLET | Freq: Every evening | ORAL | 0 refills | Status: AC | PRN

## 2025-01-13 MED ORDER — VENLAFAXINE HCL ER 75 MG PO CP24: 75.0000 mg | ORAL_CAPSULE | Freq: Every day | ORAL | 0 refills | Status: AC

## 2025-01-13 MED ORDER — METFORMIN HCL ER 500 MG PO TB24: 1000.0000 mg | ORAL_TABLET | Freq: Every day | ORAL | 0 refills | Status: AC

## 2025-01-13 MED ORDER — HYDROXYZINE HCL 25 MG PO TABS: 25.0000 mg | ORAL_TABLET | Freq: Three times a day (TID) | ORAL | 0 refills | Status: AC | PRN

## 2025-01-13 MED ORDER — ASPIRIN 81 MG PO TBEC: 81.0000 mg | DELAYED_RELEASE_TABLET | Freq: Every day | ORAL | 0 refills | Status: AC

## 2025-01-13 NOTE — Group Note (Signed)
 [facility] LCSW Group Therapy Note   Group Date: 01/13/2025 Start Time: 1400 End Time: 1500   Type of Therapy/Topic:  Group Therapy:  Emotion Regulation  Participation Level:  Did Not Attend   Mood: Not able to assess  Description of Group:    The purpose of this group is to assist patients in learning to regulate negative emotions and experience positive emotions. Patients will be guided to discuss ways in which they have been vulnerable to their negative emotions. These vulnerabilities will be juxtaposed with experiences of positive emotions or situations, and patients challenged to use positive emotions to combat negative ones. Special emphasis will be placed on coping with negative emotions in conflict situations, and patients will process healthy conflict resolution skills.  Therapeutic Goals: Patient will identify two positive emotions or experiences to reflect on in order to balance out negative emotions:  Patient will label two or more emotions that they find the most difficult to experience:  Patient will be able to demonstrate positive conflict resolution skills through discussion or role plays:   Summary of Patient Progress: Pt did not participate.   Therapeutic Modalities:   Cognitive Behavioral Therapy Feelings Identification Dialectical Behavioral Therapy   Rexene LELON Mae, LCSWA

## 2025-01-13 NOTE — Group Note (Signed)
 Date:  01/13/2025 Time:  11:18 AM  Group Topic/Focus:  Goals Group:   The focus of this group is to help patients establish daily goals to achieve during treatment and discuss how the patient can incorporate goal setting into their daily lives to aide in recovery.    Participation Level:  Did Not Attend   Tarrell Debes L Damante Spragg 01/13/2025, 11:18 AM

## 2025-01-13 NOTE — [facility] Suicide Risk Assessment (Signed)
 [facility] Discharge Suicide Risk Assessment   Principal Problem: MDD (major depressive disorder), recurrent severe, without psychosis (HCC) Discharge Diagnoses: Principal Problem:   MDD (major depressive disorder), recurrent severe, without psychosis (HCC)   Total Time spent with patient: 30 minutes  Patient was seen this morning for psychiatric rounds. Patient reports she is feeling better mentally and emotionally. She reports that she is ready to take charge of her life again and wants to return home. 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 Patient denies having current suicidal or homicidal ideations or perceptual disturbances. She has clear, linear thoughts, judgement and insight are intact.   Musculoskeletal: Strength & Muscle Tone: within normal limits Gait & Station: normal Patient leans: N/A  Psychiatric Specialty Exam  Presentation  General Appearance:  Appropriate for Environment  Eye Contact: Fair  Speech: Clear and Coherent  Speech Volume: Decreased  Handedness:No data recorded  Mood and Affect  Mood: Dysphoric; Irritable  Duration of Depression Symptoms: No data recorded Affect: Depressed; Flat   Thought Process  Thought Processes: Coherent  Descriptions of Associations:Intact  Orientation:Full (Time, Place and Person)  Thought Content:Illogical  History of Schizophrenia/Schizoaffective disorder:No data recorded Duration of Psychotic Symptoms:No data recorded Hallucinations:No data recorded Ideas of Reference:None  Suicidal Thoughts:No data recorded Homicidal Thoughts:No data recorded  Sensorium  Memory: Immediate Fair; Remote Fair  Judgment: Impaired  Insight: Shallow   Executive Functions  Concentration: Fair  Attention Span: Fair  Recall: [employer] of Knowledge: Fair  Language: Fair   Psychomotor Activity  Psychomotor Activity:No data recorded  Assets   Assets: Communication Skills; Resilience   Sleep  Sleep:No data recorded Estimated Sleeping Duration (Last 24 Hours): 7.25-9.25 hours  Physical Exam: Physical Exam ROS Blood pressure (!) 161/94, pulse 99, temperature 98.2 F (36.8 C), temperature source Oral, resp. rate 15, height 5' 4 (1.626 m), weight 78 kg, SpO2 99%. Body mass index is 29.52 kg/m.  Mental Status Per Nursing Assessment::   On Admission:  NA  Demographic Factors:  Caucasian, Low socioeconomic status, and Unemployed  Loss Factors: Financial problems/change in socioeconomic status  Historical Factors: Prior suicide attempts and Impulsivity  Risk Reduction Factors:   Living with another person, especially a relative  Continued Clinical Symptoms:  Depression:   Hopelessness Alcohol/Substance Abuse/Dependencies More than one psychiatric diagnosis  Cognitive Features That Contribute To Risk:  None    Suicide Risk:  Minimal: No identifiable suicidal ideation.  Patients presenting with no risk factors but with morbid ruminations; may be classified as minimal risk based on the severity of the depressive symptoms   Follow-up Information     [facility], [facility]. Go to.   Why: Your appointment is scheduled for Wednesday, 01/16/25 at 11AM. Contact information: [address] [place] [address] [phone]                 Plan Of Care/Follow-up recommendations:  Activity:  Normal as tolerated Diet:  Diabetic diet Other:  Follow up with PCP regarding new BP medication and attend follow up outpatient psychiatry appointments as scheduled.  Camelia JINNY Mountain, NP 01/13/2025, 11:42 AM

## 2025-01-13 NOTE — Progress Notes (Signed)
 Patient belongings returned. Discharge instructions reviewed.

## 2025-01-13 NOTE — Progress Notes (Signed)
" °  [facility] Adult Case Management Discharge Plan :  Will you be returning to the same living situation after discharge:  Yes,  pt will  At discharge, do you have transportation home?: Yes,  pt's husband will pick her up  Do you have the ability to pay for your medications: Yes,  [employer]  Release of information consent forms completed and in the chart;  Patient's signature needed at discharge.  Patient to Follow up at:  Follow-up Information     [facility], [facility]. Go to.   Why: Your appointment is scheduled for Wednesday, 01/16/25 at 11AM. Contact information: [address] [place] [address] [phone]                 Next level of care provider has access to [facility] Link:no  Safety Planning and Suicide Prevention discussed: Chaney Sharper Long/husband [phone])     Has patient been referred to the Quitline?: Patient does not use tobacco/nicotine products  Patient has been referred for addiction treatment: No known substance use disorder.  [address] LCSWA 01/13/2025, 10:36 AM "

## 2025-01-13 NOTE — Plan of Care (Signed)
" °  Problem: Education: Goal: Knowledge of [facility] Education information/materials will improve Outcome: Progressing   Problem: Education: Goal: Emotional status will improve Outcome: Progressing   Problem: Education: Goal: Mental status will improve Outcome: Progressing   Problem: Education: Goal: Knowledge of [facility] Education information/materials will improve Outcome: Progressing   "

## 2025-01-13 NOTE — Discharge Summary (Addendum)
 " Physician Discharge Summary Note  Patient:  Christine Schaefer is an 54 y.o., female MRN:  [id number] DOB:  1971-12-13 Patient phone:  [phone] (home)  Patient address:   [address] [place] [address],  Total Time spent with patient: 30 minutes  Date of Admission:  01/08/2025 Date of Discharge: 01/13/2025  Reason for Admission:  Major Depressive Disorder,  Suicide Attempt  Principal Problem: <principal problem not specified> Discharge Diagnoses: Active Problems:   MDD (major depressive disorder), recurrent severe, without psychosis (HCC)   Past Psychiatric History: Major Depressive Disorder, Generalized Anxiety Disorder  Past Medical History:  Past Medical History:  Diagnosis Date   Hypertension     Past Surgical History:  Procedure Laterality Date   EXPLORATORY LAPAROTOMY     Family History:  Family History  Problem Relation Age of Onset   Breast cancer Maternal Aunt    Breast cancer Paternal Aunt    Family Psychiatric  History: Unknown Social History:  Social History   Substance and Sexual Activity  Alcohol Use Yes   Comment: 3 or 4 cans two or three times a week.     Social History   Substance and Sexual Activity  Drug Use Never    Social History   Socioeconomic History   Marital status: Married    Spouse name: Not on file   Number of children: Not on file   Years of education: Not on file   Highest education level: Not on file  Occupational History   Not on file  Tobacco Use   Smoking status: Never   Smokeless tobacco: Never  Substance and Sexual Activity   Alcohol use: Yes    Comment: 3 or 4 cans two or three times a week.   Drug use: Never   Sexual activity: Not on file  Other Topics Concern   Not on file  Social History Narrative   Not on file   Social Drivers of Health   Tobacco Use: Low Risk (01/08/2025)   Patient History    Smoking Tobacco Use: Never    Smokeless Tobacco Use: Never    Passive Exposure: Not on file  Recent  Concern: Tobacco Use - Medium Risk (12/04/2024)   Received from [facility]   Patient History    Smoking Tobacco Use: Former    Smokeless Tobacco Use: Never    Passive Exposure: Not on file  Financial Resource Strain: Medium Risk (09/29/2023)   Received from [facility]   Overall Financial Resource Strain (CARDIA)    Difficulty of Paying Living Expenses: Somewhat hard  Food Insecurity: No Food Insecurity (01/08/2025)   Epic    Worried About Running Out of Food in the Last Year: Never true    Ran Out of Food in the Last Year: Never true  Transportation Needs: No Transportation Needs (01/08/2025)   Epic    Lack of Transportation (Medical): No    Lack of Transportation (Non-Medical): No  Physical Activity: Not on file  Stress: Not on file  Social Connections: Not on file  Depression (EYV7-0): Not on file  Alcohol Screen: Low Risk (01/08/2025)   Alcohol Screen    Last Alcohol Screening Score (AUDIT): 6  Housing: Low Risk (01/08/2025)   Epic    Unable to Pay for Housing in the Last Year: No    Number of Times Moved in the Last Year: 0    Homeless in the Last Year: No  Utilities: Not At Risk (01/08/2025)  Epic    Threatened with loss of utilities: No  Health Literacy: Not on file    Hospital Course:  Patient is a 54 year old female who presented to the hospital ED after making a suicide attempt by intentionally overdosing on her blood pressure medication in combination with alcohol. Patient endorsed she was severely depressed and could not handle the stressors of life. Patient was admitted for stabilization and treatment to the inpatient psychiatric unit and has since had notable endorsement and observation of improvement in depression and anxiety. Patient has participated in milieu activities which she expresses she has found helpful. Patient denies having current suicidal or homicidal ideations or perceptual disturbances. She has clear, linear thoughts,  judgement and insight are intact. Patient is psychiatrically stable for discharge today.  Physical Findings: AIMS:  , ,  ,  ,  ,  ,   CIWA:  CIWA-Ar Total: 0 COWS:     Musculoskeletal: Strength & Muscle Tone: within normal limits Gait & Station: normal Patient leans: N/A   Psychiatric Specialty Exam:  Presentation  General Appearance:  Appropriate for Environment; Casual; Neat  Eye Contact: Good  Speech: Clear and Coherent; Normal Rate  Speech Volume: Normal  Handedness:No data recorded  Mood and Affect  Mood: Dysphoric  Affect: Appropriate   Thought Process  Thought Processes: Coherent; Linear; Goal Directed  Descriptions of Associations:Intact  Orientation:Full (Time, Place and Person)  Thought Content:Abstract Reasoning; Logical  History of Schizophrenia/Schizoaffective disorder:No data recorded Duration of Psychotic Symptoms:No data recorded Hallucinations:Hallucinations: None  Ideas of Reference:None  Suicidal Thoughts:Suicidal Thoughts: No  Homicidal Thoughts:Homicidal Thoughts: No   Sensorium  Memory: Immediate Good; Recent Good; Remote Good  Judgment: Good  Insight: Good   Executive Functions  Concentration: Good  Attention Span: Good  Recall: Good  Fund of Knowledge: Good  Language: Good   Psychomotor Activity  Psychomotor Activity: Psychomotor Activity: Normal   Assets  Assets: Communication Skills; Desire for Improvement; Housing; Talents/Skills; Social Support; Transportation   Sleep  Sleep: Sleep: Good  Estimated Sleeping Duration (Last 24 Hours): 7.25-9.25 hours   Physical Exam: Physical Exam ROS Blood pressure (!) 161/94, pulse 99, temperature 98.2 F (36.8 C), temperature source Oral, resp. rate 15, height 5' 4 (1.626 m), weight 78 kg, SpO2 99%. Body mass index is 29.52 kg/m.   Tobacco Use History[1] Tobacco Cessation:  N/A, patient does not currently use tobacco products   Blood  Alcohol level:  Lab Results  Component Value Date   ETH 312 [facility]) 01/07/2025   ETH <15 12/05/2024    Metabolic Disorder Labs:  Lab Results  Component Value Date   HGBA1C 7.7 (H) 01/11/2025   MPG 174.29 01/11/2025   MPG 159.94 12/07/2024   No results found for: PROLACTIN Lab Results  Component Value Date   CHOL 280 (H) 01/11/2025   TRIG 241 (H) 01/11/2025   HDL 39 (L) 01/11/2025   CHOLHDL 7.1 01/11/2025   VLDL 48 (H) 01/11/2025   LDLCALC 192 (H) 01/11/2025    See Psychiatric Specialty Exam and Suicide Risk Assessment completed by Attending Physician prior to discharge.  Discharge destination:  Home  Is patient on multiple antipsychotic therapies at discharge:  No   Has Patient had three or more failed trials of antipsychotic monotherapy by history:  No  Recommended Plan for Multiple Antipsychotic Therapies: NA     Follow-up Information     [facility]. Go to.   Why: Your appointment is scheduled for Wednesday, 01/16/25 at 11AM. Contact  information: [address] [place] [address] [phone]                 Follow-up recommendations:  Activity:  Normal, as tolerated Diet:  Diabetic Diet Other:   Follow up with PCP regarding new BP medication and attend follow up outpatient psychiatry appointments as scheduled.  Comments:  Patient may return to [facility] ED at anytime if she feels it is necessary to receive an evaluation for any reason.  Signed: Camelia JINNY Mountain, NP 01/13/2025, 4:25 PM           [1]  Social History Tobacco Use  Smoking Status Never  Smokeless Tobacco Never   "

## 2025-01-13 NOTE — Progress Notes (Signed)
 [facility] MD Progress Note  01/13/2025 3:05 PM Christine Schaefer  MRN:  [id number]  Christine Schaefer is a 54 year old female who presented to the Emergency department on 01/07/2025 for concerns of SI. Patient reportedly drank a lot of alcohol and then took 6 of 12.5 lisinopril 's around 4 PM and came in with concerns for SI attempt. According to EMS there was no signs of trauma and that she was alert and answering all questions.   Subjective:  Chart reviewed, case discussed in multidisciplinary meeting, patient seen during rounds.   01/13/2025: Patient was seen this morning for psychiatric reassessment during rounds. Patient is alert and oriented X 4, calm, cooperative, and engages well in the interview. 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 Patient reports she plans to follow up with her PCP regarding her BP and receive HTN medication. Patient endorses some improvements with depressive symptoms, rates her depression a 2 out of 10 today and her anxiety a 4 out of 10. She has notable endorsement and observation of improvement in depression and anxiety, observed with a euthymic affect and mood. Patient has participated in milieu activities which she expresses she has found helpful. Patient denies having current suicidal or homicidal ideations or perceptual disturbances. She has clear, linear thoughts, judgement and insight are intact. Patient is psychiatrically stable for discharge to home with her husband to transport her home.  01/12/2025: Patient was seen this morning for psychiatric reassessment during rounds. Patient is alert and oriented X 4, calm, cooperative, and engages well in the interview. Patient reports she does not feel well this morning secondary to her BP being elevated. She relates attempting to overdose on the BP medication and not taking it since admission. Patient was informed that she will receive  Hydralazine  25 mg daily scheduled to assist with stabilizing her BP to prevent hypertensive episodes. Patient endorses some improvements with depressive symptoms, rates her depression a 4 out of 10 today and her anxiety a 6 out of 10. She presents with clear, linear thoughts, judgement and insight was intact. Patient denies any alcohol withdrawal symptoms today. Patient reports she is eating but has difficulty sleeping. Patient also denies current suicidal, homicidal ideation, or perceptual disturbances. Patient will be discharging tomorrow and her husband with plans for her husband to transport her home.   Patient reports ongoing improvement in her mood.  With the Librium  taper she denies any acute withdrawal from alcohol today.  She reports able to sleep and eat well.  She denies nausea vomiting or any shakes.  Reviewed taper is ongoing.  Patient is educated on increased dose of Effexor  to help with anxiety.  She denies SI/HI/plan.  Provider reached out to patient's husband who corroborated patient's main stressors being jobless.  He also confirmed that patient applied for many jobs and there are some emails confirming a job offer for that she needs to responsive.  Provider and patient discussed about discharge planning for Sunday so that she can attend to her new job next week.  Husband is also requesting for a week and discharge as he has transportation issues on weekdays Past Psychiatric History: see h&P Family History:  Family History  Problem Relation Age of Onset   Breast cancer Maternal Aunt    Breast cancer Paternal Aunt    Social History:  Social History   Substance and Sexual Activity  Alcohol Use Yes   Comment: 3 or 4  cans two or three times a week.     Social History   Substance and Sexual Activity  Drug Use Never    Social History   Socioeconomic History   Marital status: Married    Spouse name: Not on file   Number of children: Not on file   Years of education: Not on  file   Highest education level: Not on file  Occupational History   Not on file  Tobacco Use   Smoking status: Never   Smokeless tobacco: Never  Substance and Sexual Activity   Alcohol use: Yes    Comment: 3 or 4 cans two or three times a week.   Drug use: Never   Sexual activity: Not on file  Other Topics Concern   Not on file  Social History Narrative   Not on file   Social Drivers of Health   Tobacco Use: Low Risk (01/08/2025)   Patient History    Smoking Tobacco Use: Never    Smokeless Tobacco Use: Never    Passive Exposure: Not on file  Recent Concern: Tobacco Use - Medium Risk (12/04/2024)   Received from [facility]   Patient History    Smoking Tobacco Use: Former    Smokeless Tobacco Use: Never    Passive Exposure: Not on file  Financial Resource Strain: Medium Risk (09/29/2023)   Received from [facility]   Overall Financial Resource Strain (CARDIA)    Difficulty of Paying Living Expenses: Somewhat hard  Food Insecurity: No Food Insecurity (01/08/2025)   Epic    Worried About Running Out of Food in the Last Year: Never true    Ran Out of Food in the Last Year: Never true  Transportation Needs: No Transportation Needs (01/08/2025)   Epic    Lack of Transportation (Medical): No    Lack of Transportation (Non-Medical): No  Physical Activity: Not on file  Stress: Not on file  Social Connections: Not on file  Depression (EYV7-0): Not on file  Alcohol Screen: Low Risk (01/08/2025)   Alcohol Screen    Last Alcohol Screening Score (AUDIT): 6  Housing: Low Risk (01/08/2025)   Epic    Unable to Pay for Housing in the Last Year: No    Number of Times Moved in the Last Year: 0    Homeless in the Last Year: No  Utilities: Not At Risk (01/08/2025)   Epic    Threatened with loss of utilities: No  Health Literacy: Not on file   Past Medical History:  Past Medical History:  Diagnosis Date   Hypertension     Past Surgical History:   Procedure Laterality Date   EXPLORATORY LAPAROTOMY      Current Medications: Current Facility-Administered Medications  Medication Dose Route Frequency Provider Last Rate Last Admin   acetaminophen  (TYLENOL ) tablet 650 mg  650 mg Oral Q6H PRN Smith, Annie B, NP   650 mg at 01/13/25 0829   alum & mag hydroxide-simeth (MAALOX/MYLANTA) 200-200-20 MG/5ML suspension 30 mL  30 mL Oral Q4H PRN Smith, Annie B, NP   30 mL at 01/12/25 1629   aspirin  EC tablet 81 mg  81 mg Oral Daily Smith, Annie B, NP   81 mg at 01/13/25 0825   hydrALAZINE  (APRESOLINE ) tablet 25 mg  25 mg Oral Q8H PRN Jadapalle, Sree, MD   25 mg at 01/13/25 9173   hydrOXYzine  (ATARAX ) tablet 25 mg  25 mg Oral Q6H PRN Jadapalle, Sree, MD   25  mg at 01/13/25 9170   magnesium  hydroxide (MILK OF MAGNESIA) suspension 30 mL  30 mL Oral Daily PRN Smith, Annie B, NP       multivitamin with minerals tablet 1 tablet  1 tablet Oral Daily Jadapalle, Sree, MD   1 tablet at 01/13/25 0825   OLANZapine  (ZYPREXA ) injection 5 mg  5 mg Intramuscular TID PRN Smith, Annie B, NP       OLANZapine  zydis (ZYPREXA ) disintegrating tablet 5 mg  5 mg Oral TID PRN Smith, Annie B, NP       traZODone  (DESYREL ) tablet 50 mg  50 mg Oral QHS PRN Smith, Annie B, NP   50 mg at 01/12/25 2117   venlafaxine  XR (EFFEXOR -XR) 24 hr capsule 75 mg  75 mg Oral Q breakfast Jadapalle, Sree, MD   75 mg at 01/13/25 0825    Lab Results:  No results found for this or any previous visit (from the past 48 hours).   Blood Alcohol level:  Lab Results  Component Value Date   ETH 312 [facility]) 01/07/2025   ETH <15 12/05/2024    Metabolic Disorder Labs: Lab Results  Component Value Date   HGBA1C 7.7 (H) 01/11/2025   MPG 174.29 01/11/2025   MPG 159.94 12/07/2024   No results found for: PROLACTIN Lab Results  Component Value Date   CHOL 280 (H) 01/11/2025   TRIG 241 (H) 01/11/2025   HDL 39 (L) 01/11/2025   CHOLHDL 7.1 01/11/2025   VLDL 48 (H) 01/11/2025   LDLCALC 192 (H)  01/11/2025    Physical Findings: AIMS:  , ,  ,  ,    CIWA:  CIWA-Ar Total: 0 COWS:      Psychiatric Specialty Exam:  Presentation  General Appearance:  Appropriate for Environment; Casual; Neat  Eye Contact: Good  Speech: Clear and Coherent; Normal Rate  Speech Volume: Normal    Mood and Affect  Mood: Fine Affect: Stable  Thought Process  Thought Processes: Coherent; Linear; Goal Directed  Orientation:Full (Time, Place and Person)  Thought Content: Logical Hallucinations: Denies  Ideas of Reference:None  Suicidal Thoughts: Denies  Homicidal Thoughts:Denies   Sensorium  Memory: Immediate Good; Recent Good; Remote Good  Judgment: Good  Insight: Good   Executive Functions  Concentration: Good  Attention Span: Good  Recall: Good  Fund of Knowledge: Good  Language: Good   Psychomotor Activity  Psychomotor Activity: Psychomotor Activity: Normal   Musculoskeletal: Strength & Muscle Tone: within normal limits Gait & Station: normal Assets  Assets: Manufacturing Systems Engineer; Desire for Improvement; Housing; Talents/Skills; Social Support; Transportation    Physical Exam: Physical Exam ROS Blood pressure (!) 161/94, pulse 99, temperature 98.2 F (36.8 C), temperature source Oral, resp. rate 15, height 5' 4 (1.626 m), weight 78 kg, SpO2 99%. Body mass index is 29.52 kg/m.  Diagnosis: Principal Problem:   MDD (major depressive disorder), recurrent severe, without psychosis (HCC)    Treatment Plan Summary:  Safety and Monitoring:             -- Involuntary admission to inpatient psychiatric unit for safety, stabilization and treatment             -- Daily contact with patient to assess and evaluate symptoms and progress in treatment             -- Patient's case to be discussed in multi-disciplinary team meeting             -- Observation Level: q15 minute checks             --  Vital signs:  q12 hours             --  Precautions: suicide, elopement, and assault   2. Psychiatric Diagnoses and Treatment:               Continue Effexor  XR 75 mg daily to optimize anxiety CIWA discontinued   -- The risks/benefits/side-effects/alternatives to this medication were discussed in detail with the patient and time was given for questions. The patient consents to medication trial.                -- Metabolic profile and EKG monitoring obtained while on an atypical antipsychotic (BMI: Lipid Panel: HbgA1c: QTc:)              -- Encouraged patient to participate in unit milieu and in scheduled group therapies                            3. Medical Issues Being Addressed:    -Initiate Hydralazine  25 mg daily for HTN.   4. Discharge Planning:   -- Social work and case management to assist with discharge planning and identification of hospital follow-up needs prior to discharge  -- Estimated LOS: 3-4 days  Camelia JINNY Mountain, NP 01/13/2025, 3:05 PM
